# Patient Record
Sex: Male | Born: 1955 | Race: White | Hispanic: No | Marital: Married | Smoking: Never smoker
Health system: Southern US, Community
[De-identification: ages and names within clinical notes are randomized; demographics above are authoritative.]

## PROBLEM LIST (undated history)

## (undated) DIAGNOSIS — D649 Anemia, unspecified: Secondary | ICD-10-CM

## (undated) DIAGNOSIS — I350 Nonrheumatic aortic (valve) stenosis: Secondary | ICD-10-CM

## (undated) DIAGNOSIS — N189 Chronic kidney disease, unspecified: Secondary | ICD-10-CM

## (undated) DIAGNOSIS — R519 Headache, unspecified: Secondary | ICD-10-CM

## (undated) DIAGNOSIS — Z8614 Personal history of Methicillin resistant Staphylococcus aureus infection: Secondary | ICD-10-CM

## (undated) DIAGNOSIS — J40 Bronchitis, not specified as acute or chronic: Secondary | ICD-10-CM

## (undated) DIAGNOSIS — L0390 Cellulitis, unspecified: Secondary | ICD-10-CM

## (undated) DIAGNOSIS — G4733 Obstructive sleep apnea (adult) (pediatric): Secondary | ICD-10-CM

## (undated) DIAGNOSIS — K7460 Unspecified cirrhosis of liver: Secondary | ICD-10-CM

## (undated) DIAGNOSIS — M1990 Unspecified osteoarthritis, unspecified site: Secondary | ICD-10-CM

## (undated) DIAGNOSIS — Z862 Personal history of diseases of the blood and blood-forming organs and certain disorders involving the immune mechanism: Secondary | ICD-10-CM

## (undated) DIAGNOSIS — K7469 Other cirrhosis of liver: Secondary | ICD-10-CM

## (undated) DIAGNOSIS — J302 Other seasonal allergic rhinitis: Secondary | ICD-10-CM

## (undated) DIAGNOSIS — E119 Type 2 diabetes mellitus without complications: Secondary | ICD-10-CM

## (undated) DIAGNOSIS — C801 Malignant (primary) neoplasm, unspecified: Secondary | ICD-10-CM

## (undated) DIAGNOSIS — K7581 Nonalcoholic steatohepatitis (NASH): Secondary | ICD-10-CM

## (undated) HISTORY — PX: ROTATOR CUFF REPAIR: SHX139

## (undated) HISTORY — PX: CHOLECYSTECTOMY: SHX55

## (undated) HISTORY — PX: CERVICAL FUSION: SHX112

## (undated) HISTORY — PX: TONSILLECTOMY: SUR1361

---

## 2004-11-22 ENCOUNTER — Emergency Department: Payer: Self-pay | Admitting: Emergency Medicine

## 2006-06-12 ENCOUNTER — Emergency Department: Payer: Self-pay | Admitting: Internal Medicine

## 2006-10-13 ENCOUNTER — Ambulatory Visit (HOSPITAL_COMMUNITY): Admission: RE | Admit: 2006-10-13 | Discharge: 2006-10-14 | Payer: Self-pay | Admitting: Neurosurgery

## 2009-02-15 ENCOUNTER — Emergency Department: Payer: Self-pay | Admitting: Unknown Physician Specialty

## 2009-02-28 ENCOUNTER — Ambulatory Visit: Payer: Self-pay

## 2009-03-14 ENCOUNTER — Ambulatory Visit: Payer: Self-pay | Admitting: Orthopedic Surgery

## 2009-03-18 ENCOUNTER — Ambulatory Visit: Payer: Self-pay | Admitting: Orthopedic Surgery

## 2009-03-31 ENCOUNTER — Ambulatory Visit: Payer: Self-pay | Admitting: Internal Medicine

## 2009-04-27 ENCOUNTER — Inpatient Hospital Stay: Payer: Self-pay | Admitting: Internal Medicine

## 2009-04-30 ENCOUNTER — Ambulatory Visit: Payer: Self-pay | Admitting: Internal Medicine

## 2009-05-05 ENCOUNTER — Ambulatory Visit: Payer: Self-pay | Admitting: Internal Medicine

## 2009-05-31 ENCOUNTER — Ambulatory Visit: Payer: Self-pay | Admitting: Internal Medicine

## 2009-06-06 ENCOUNTER — Inpatient Hospital Stay: Payer: Self-pay | Admitting: Internal Medicine

## 2009-06-27 ENCOUNTER — Ambulatory Visit: Payer: Self-pay | Admitting: Specialist

## 2009-07-01 ENCOUNTER — Ambulatory Visit: Payer: Self-pay | Admitting: Internal Medicine

## 2009-07-21 ENCOUNTER — Ambulatory Visit: Payer: Self-pay | Admitting: Internal Medicine

## 2009-07-29 ENCOUNTER — Ambulatory Visit: Payer: Self-pay | Admitting: Internal Medicine

## 2009-08-08 ENCOUNTER — Ambulatory Visit: Payer: Self-pay | Admitting: Surgery

## 2009-08-13 ENCOUNTER — Ambulatory Visit: Payer: Self-pay | Admitting: Surgery

## 2009-08-29 ENCOUNTER — Ambulatory Visit: Payer: Self-pay | Admitting: Internal Medicine

## 2009-09-28 ENCOUNTER — Ambulatory Visit: Payer: Self-pay | Admitting: Internal Medicine

## 2009-10-29 ENCOUNTER — Ambulatory Visit: Payer: Self-pay | Admitting: Internal Medicine

## 2009-12-29 ENCOUNTER — Ambulatory Visit: Payer: Self-pay | Admitting: Internal Medicine

## 2009-12-31 ENCOUNTER — Ambulatory Visit: Payer: Self-pay | Admitting: Internal Medicine

## 2010-01-08 ENCOUNTER — Ambulatory Visit: Payer: Self-pay | Admitting: Gastroenterology

## 2010-06-23 ENCOUNTER — Ambulatory Visit: Payer: Self-pay | Admitting: Internal Medicine

## 2010-09-17 IMAGING — CT CT ABD-PELV W/ CM
1 of 2 series · 16 of 32 positions shown, 20 images · non-contrast
Comparison: none

REASON FOR EXAM: splenomegaly  LLQ pain  pancytopenia eval
lymphadenopathy that would be Malawani...
COMMENTS:

PROCEDURE:     LANGAWI - LANGAWI ABDOMEN / PELVIS W  - July 29, 2009  [DATE]
RESULT:
HISTORY: Pancytopenia, splenomegaly.
COMPARISON STUDY:   Ultrasound of the abdomen of 07/21/2009.

[Series 2: soft tissue · axial · 0.86mm/px · z∈[-1072,-602]mm · 16 of 104 slices shown, 20 images]
[im 5/104  soft-tissue]
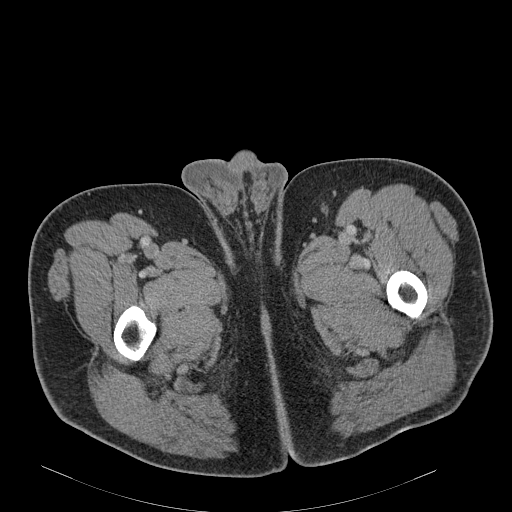
[im 5/104  bone]
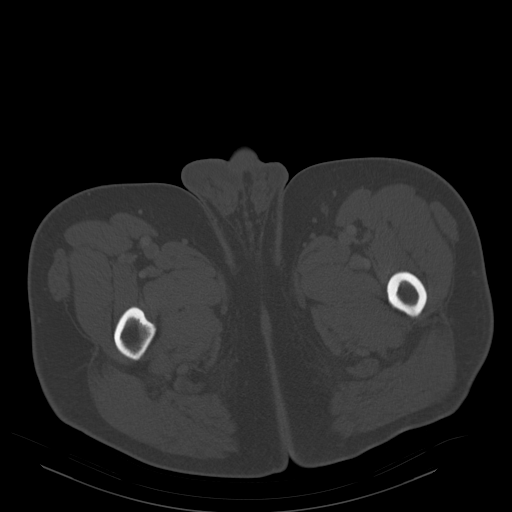
[im 13/104  soft-tissue]
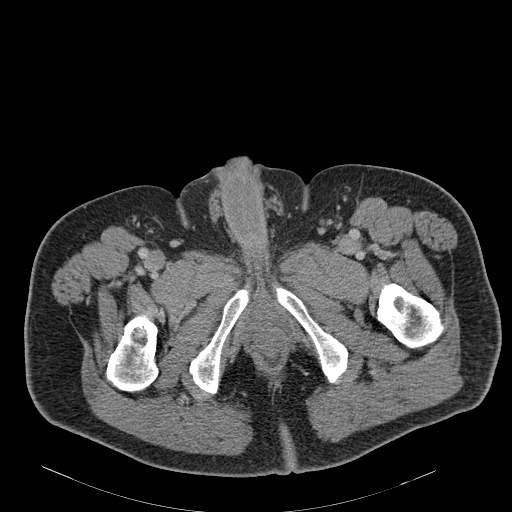
[im 22/104  soft-tissue]
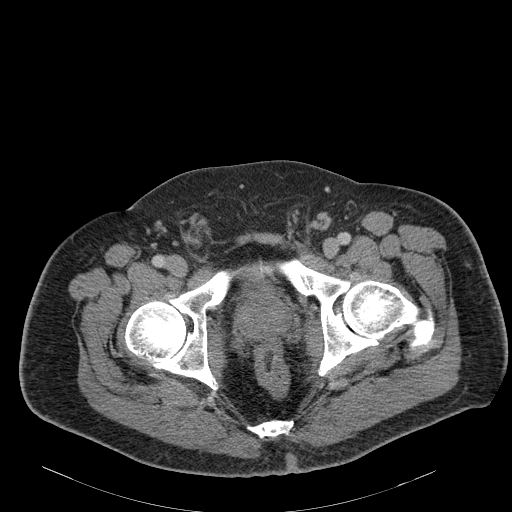
[im 26/104  soft-tissue]
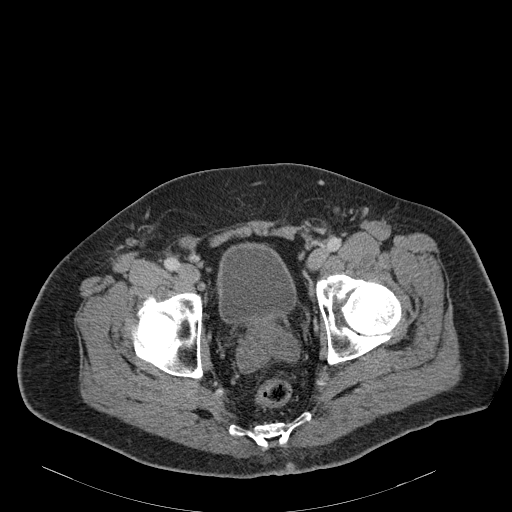
[im 35/104  soft-tissue]
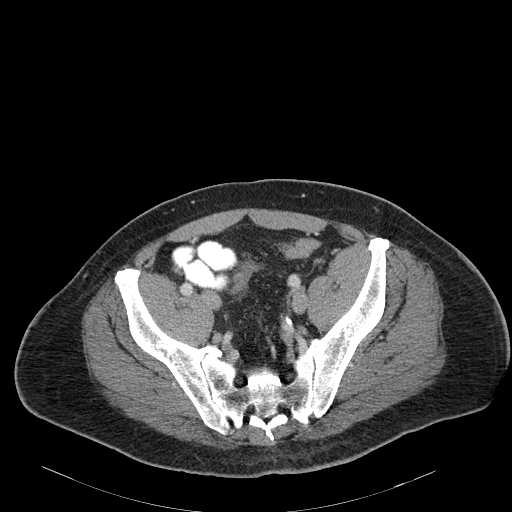
[im 43/104  soft-tissue]
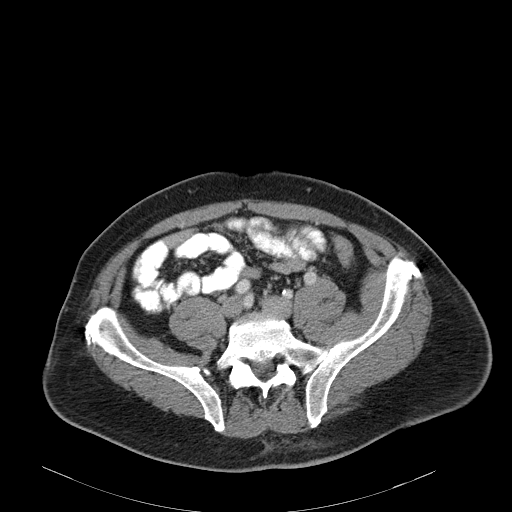
[im 48/104  soft-tissue]
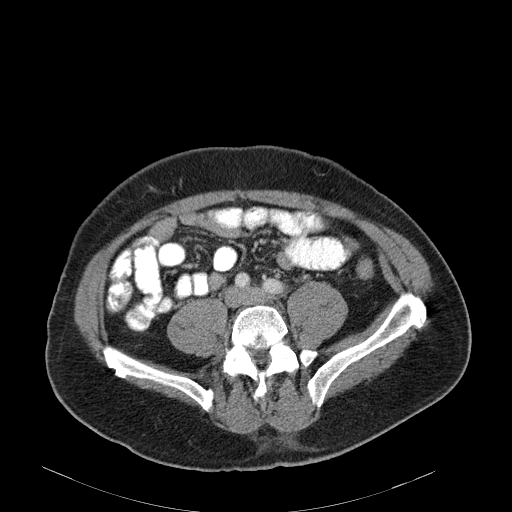
[im 56/104  soft-tissue]
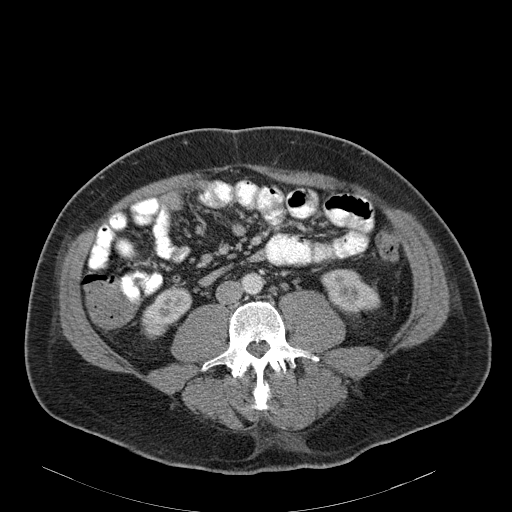
[im 61/104  soft-tissue]
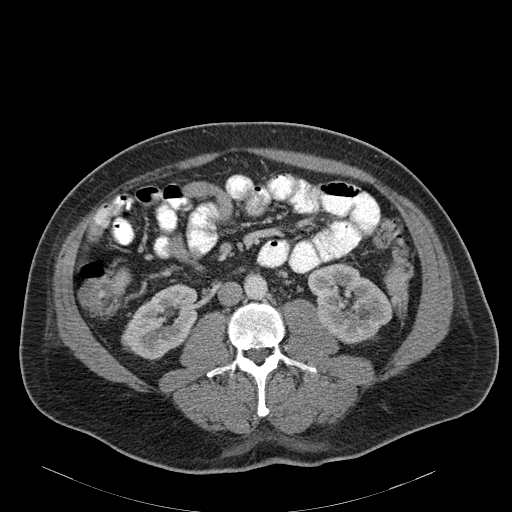
[im 61/104  bone]
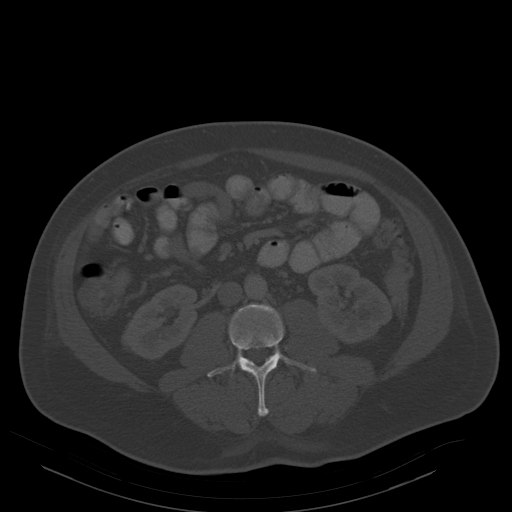
[im 69/104  soft-tissue]
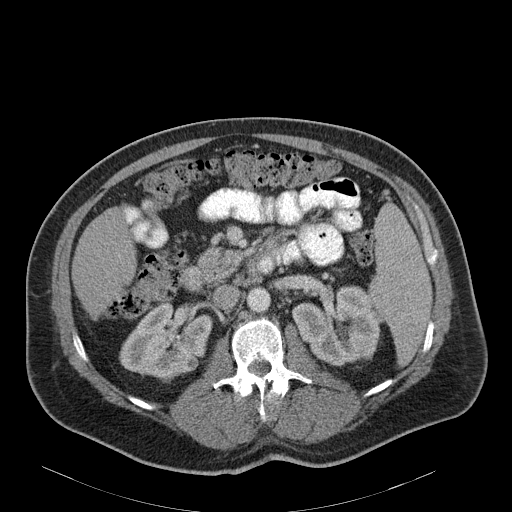
[im 78/104  soft-tissue]
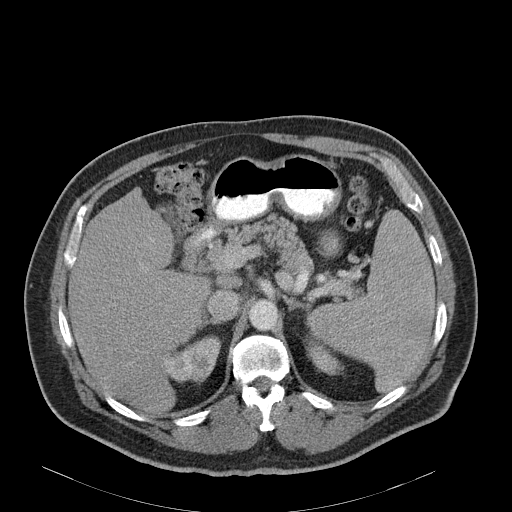
[im 82/104  soft-tissue]
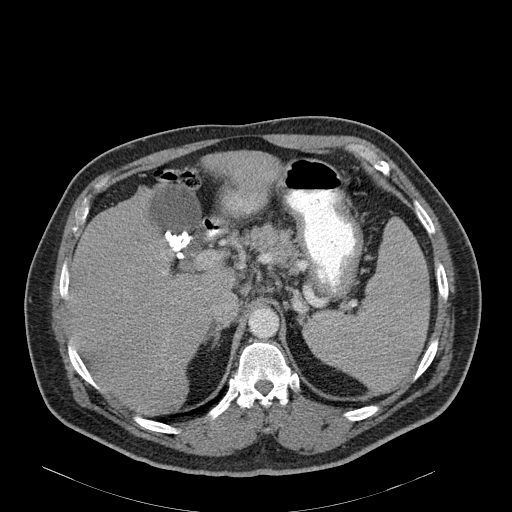
[im 86/104  lung]
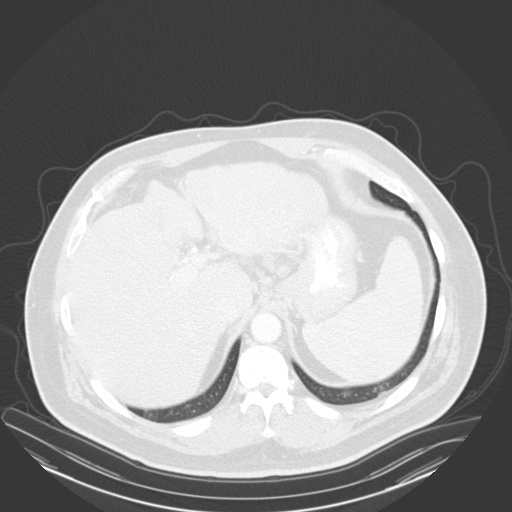
[im 91/104  soft-tissue]
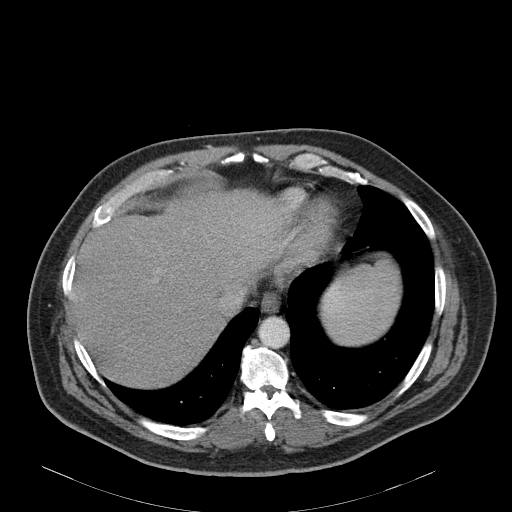
[im 91/104  lung]
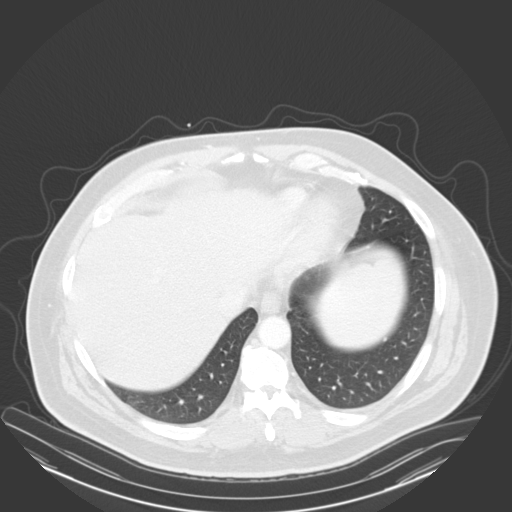
[im 95/104  lung]
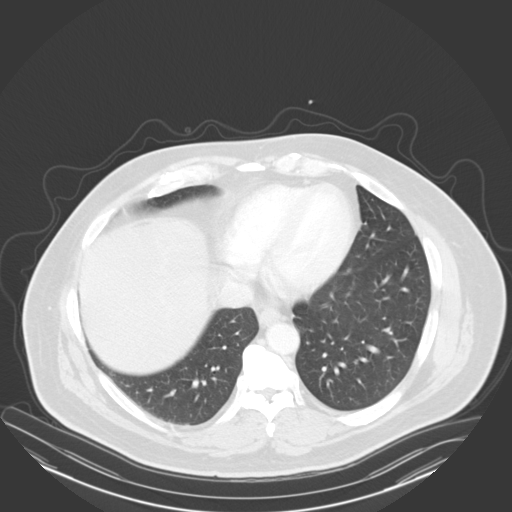
[im 99/104  soft-tissue]
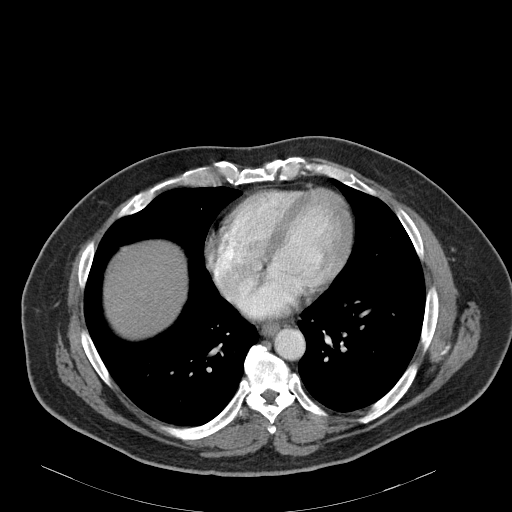
[im 99/104  lung]
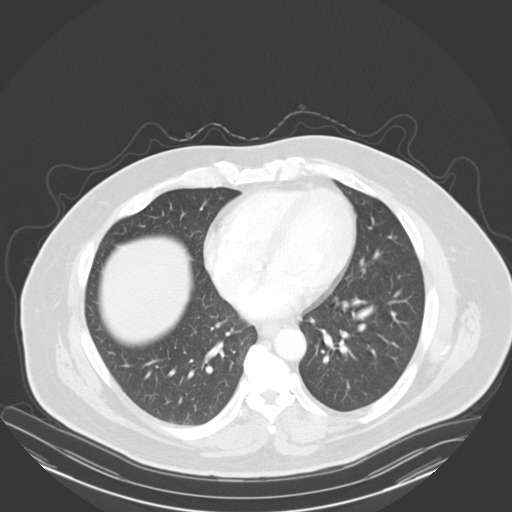

[16 of 32 positions shown; findings below may reference images not displayed]

FINDINGS: The liver is normal. Gallstones are present. Small
parapancreatic nonspecific lymph nodes are noted. No gallbladder distention
is noted. The pancreas is normal. No biliary distention. Mild splenomegaly
is present. Appendix is normal. Mesenteric vessels are patent. Portal vein
is patent. The lung bases are clear. No free air. Adrenals are normal.  The
kidneys are normal. Bilateral inguinal hernias are present.  No free pelvic
fluid is noted. The bladder is normal.
IMPRESSION: 1.     Mild splenomegaly.
2.     Gallstones.

## 2010-10-13 NOTE — Op Note (Signed)
Nathan Schultz, Nathan Schultz               ACCOUNT NO.:  [id number]   MEDICAL RECORD NO.:  [id number]          PATIENT TYPE:  AMB   LOCATION:  SDS                          FACILITY:  [facility]   PHYSICIAN:  Marchia Meiers. Vertell Limber, M.D.  DATE OF BIRTH:  10/01/1955   DATE OF PROCEDURE:  10/13/2006  DATE OF DISCHARGE:                               OPERATIVE REPORT   PREOPERATIVE DIAGNOSIS:  Herniated cervical disk with cervical  spondylosis, stenosis and radiculopathy, C6-7.   POSTOPERATIVE DIAGNOSIS:  Herniated cervical disk with cervical  spondylosis, stenosis and radiculopathy, C6-7.   PROCEDURE:  Anterior cervical decompression and fusion, C6-7, with PEEK  interbody cage, morselized bone autograft and anterior cervical plate.   SURGEON:  Marchia Meiers. Vertell Limber, M.D.   ASSISTANTS:  1. Verdis Prime, R.N.  2. Leeroy Cha, M.D.   ANESTHESIA:  General endotracheal anesthesia.   ESTIMATED BLOOD LOSS:  Minimal.   COMPLICATIONS:  None.   DISPOSITION:  To Recovery.   INDICATIONS:  Nathan Schultz is a 55 year old [employer] with left  arm weakness with a significant disk herniation at C6-7 on the left.  It  was elected to take him to surgery for anterior cervical decompression  and fusion at the C6-7 level.   PROCEDURE:  [name] was brought to the operating room.  Following a  satisfactory and uncomplicated induction of general endotracheal  anesthesia and placement of intravenous lines, the patient was placed in  a supine position on the operating table.  His neck was placed in slight  extension and he was placed in 10 pounds of halter traction.  His  anterior neck was then prepped and draped in the usual sterile fashion.  After infiltrating skin and subcutaneous tissues with 0.25% Marcaine,  0.5% lidocaine and 1:200,000 epinephrine, an incision was made from the  midline to the anterior border of the sternocleidomastoid muscle and  carried sharply through the platysmal layer.  Subplatysmal  dissection  was performed, exposing the anterior border of the sternocleidomastoid  muscle using blunt dissection.  The carotid sheath was kept lateral and  trachea and esophagus kept medial, exposing the anterior cervical spine.  A bent spinal needle was placed at what was felt to be the C5-6 and C6-7  levels.  Because of the patient's large body habitus, with multiple x-  rays were able to obtain visualization of the C5-6 level.  The longus  colli muscles were then taken down from the anterior cervical spine from  C6 through C7 with electrocautery and Key elevator and a Shadowline self-  retaining retractor was placed to facilitate exposure.  Leksell rongeur  was used to remove large ventral osteophytes and these were retained for  later use in bone grafting.  Subsequently, the interspace was incised  and disk material was removed in piecemeal fashion.  Endplates were  stripped of residual disk material.  The disk space spreader was placed  and under microscopic visualization, the endplates were decorticated.  There was a large shelf of bone inferiorly overlying C7 and the disk  space itself appeared to be quite calcified.  Under microdissection  technique, the remaining bone was thinned and then lifted with a micro  hook and 1-mm Kerrison rongeur, eventually exposing the thecal sac and  spinal cord dura.  The spinal cord dura was then decompressed initially  to the right side and subsequently to the left.  On the left side, there  was a fragment of disk material directly over the C7 nerve root and this  was decompressed.  The C7 nerve root was decompressed widely and then  extended out the neural foramen and attention was then turned to the  right side, where a similar decompression was performed.  Hemostasis was  assured with Gelfoam soaked in thrombin and after trial sizing, an 8-mm  medium PEEK interbody cage was selected, packed with morcellized bone  autograft, which was preserved  from the drilling of the endplates.  This  bone graft was then packed in the cage and the cage was inserted in the  interspace and countersunk appropriately.  A 16-millimeter Trestle  anterior cervical plate was then affixed to the anterior cervical spine  using 14-mm variable-angle screws, 2 at C6 and two at C7; all screws had  excellent purchase.  Locking mechanisms were engaged.  Final x-ray was  coned down and demonstrated the hardware at the C6-7 level.  Hemostasis  was assured and soft tissues were inspected and found to be in good  repair.  Subsequently, the platysmal layer was closed with 3-0 Vicryl  sutures and skin edges were approximated with 3-0 Vicryl interrupted,  inverted sutures.  Wound was dressed with Dermabond.  The patient was  extubated in the operating room and taken to the recovery room in stable  and satisfactory, having tolerated his operation well.  Counts were  correct at the end of the case.      Marchia Meiers. Vertell Limber, M.D.  Electronically Signed     JDS/MEDQ  D:  10/13/2006  T:  10/14/2006  Job:  [id number]

## 2010-10-13 NOTE — Op Note (Signed)
NAMEBERTRAND, VOWELS               ACCOUNT NO.:  [id number]   MEDICAL RECORD NO.:  [id number]          PATIENT TYPE:  AMB   LOCATION:  SDS                          FACILITY:  [facility]   PHYSICIAN:  Marchia Meiers. Vertell Limber, M.D.  DATE OF BIRTH:  [birth year]   DATE OF PROCEDURE:  DATE OF DISCHARGE:                               OPERATIVE REPORT   Audio too short to transcribe (less than 5 seconds)      Marchia Meiers. Vertell Limber, M.D.     JDS/MEDQ  D:  10/13/2006  T:  10/13/2006  Job:  [id number]

## 2011-03-01 ENCOUNTER — Ambulatory Visit: Payer: Self-pay | Admitting: Gastroenterology

## 2011-08-25 ENCOUNTER — Ambulatory Visit: Payer: Self-pay | Admitting: Gastroenterology

## 2012-09-07 ENCOUNTER — Ambulatory Visit: Payer: Self-pay | Admitting: Gastroenterology

## 2013-10-02 DIAGNOSIS — K7469 Other cirrhosis of liver: Secondary | ICD-10-CM | POA: Insufficient documentation

## 2013-10-02 DIAGNOSIS — K7682 Hepatic encephalopathy: Secondary | ICD-10-CM | POA: Insufficient documentation

## 2013-10-02 DIAGNOSIS — K7290 Hepatic failure, unspecified without coma: Secondary | ICD-10-CM | POA: Insufficient documentation

## 2013-12-10 ENCOUNTER — Inpatient Hospital Stay: Payer: Self-pay | Admitting: Internal Medicine

## 2013-12-10 LAB — BASIC METABOLIC PANEL
Anion Gap: 11 (ref 7–16)
BUN: 21 mg/dL — AB (ref 7–18)
CALCIUM: 8.6 mg/dL (ref 8.5–10.1)
CO2: 25 mmol/L (ref 21–32)
Chloride: 104 mmol/L (ref 98–107)
Creatinine: 1.42 mg/dL — ABNORMAL HIGH (ref 0.60–1.30)
GFR CALC NON AF AMER: 54 — AB
GLUCOSE: 228 mg/dL — AB (ref 65–99)
Osmolality: 290 (ref 275–301)
POTASSIUM: 3.5 mmol/L (ref 3.5–5.1)
SODIUM: 140 mmol/L (ref 136–145)

## 2013-12-10 LAB — CBC WITH DIFFERENTIAL/PLATELET
BASOS ABS: 0.1 10*3/uL (ref 0.0–0.1)
Basophil %: 0.7 %
EOS ABS: 0.1 10*3/uL (ref 0.0–0.7)
EOS PCT: 1.6 %
HCT: 34 % — ABNORMAL LOW (ref 40.0–52.0)
HGB: 12.3 g/dL — ABNORMAL LOW (ref 13.0–18.0)
LYMPHS ABS: 1.3 10*3/uL (ref 1.0–3.6)
Lymphocyte %: 14.7 %
MCH: 34.6 pg — ABNORMAL HIGH (ref 26.0–34.0)
MCHC: 36.1 g/dL — AB (ref 32.0–36.0)
MCV: 96 fL (ref 80–100)
MONOS PCT: 12.4 %
Monocyte #: 1.1 x10 3/mm — ABNORMAL HIGH (ref 0.2–1.0)
NEUTROS ABS: 6.3 10*3/uL (ref 1.4–6.5)
Neutrophil %: 70.6 %
Platelet: 59 10*3/uL — ABNORMAL LOW (ref 150–440)
RBC: 3.56 10*6/uL — AB (ref 4.40–5.90)
RDW: 13.9 % (ref 11.5–14.5)
WBC: 8.9 10*3/uL (ref 3.8–10.6)

## 2013-12-10 LAB — CK TOTAL AND CKMB (NOT AT ARMC)
CK, TOTAL: 132 U/L
CK, TOTAL: 116 U/L
CK, Total: 179 U/L
CK-MB: 1.9 ng/mL (ref 0.5–3.6)
CK-MB: 1.4 ng/mL (ref 0.5–3.6)
CK-MB: 1.5 ng/mL (ref 0.5–3.6)

## 2013-12-10 LAB — TROPONIN I
Troponin-I: <0.02 ng/mL
Troponin-I: <0.02 ng/mL
Troponin-I: <0.02 ng/mL

## 2013-12-11 LAB — BASIC METABOLIC PANEL
ANION GAP: 7 (ref 7–16)
BUN: 20 mg/dL — AB (ref 7–18)
CALCIUM: 8.5 mg/dL (ref 8.5–10.1)
CHLORIDE: 107 mmol/L (ref 98–107)
CO2: 25 mmol/L (ref 21–32)
Creatinine: 1.14 mg/dL (ref 0.60–1.30)
EGFR (African American): >60
Glucose: 155 mg/dL — ABNORMAL HIGH (ref 65–99)
Osmolality: 283 (ref 275–301)
POTASSIUM: 3.9 mmol/L (ref 3.5–5.1)
Sodium: 139 mmol/L (ref 136–145)

## 2013-12-11 LAB — CBC WITH DIFFERENTIAL/PLATELET
Basophil #: 0 10*3/uL (ref 0.0–0.1)
Basophil %: 0.6 %
EOS ABS: 0.2 10*3/uL (ref 0.0–0.7)
Eosinophil %: 3.5 %
HCT: 34.5 % — AB (ref 40.0–52.0)
HGB: 12.1 g/dL — AB (ref 13.0–18.0)
Lymphocyte #: 1.5 10*3/uL (ref 1.0–3.6)
Lymphocyte %: 27.4 %
MCH: 33.9 pg (ref 26.0–34.0)
MCHC: 35 g/dL (ref 32.0–36.0)
MCV: 97 fL (ref 80–100)
MONO ABS: 0.7 x10 3/mm (ref 0.2–1.0)
Monocyte %: 12.1 %
NEUTROS ABS: 3.1 10*3/uL (ref 1.4–6.5)
Neutrophil %: 56.4 %
Platelet: 59 10*3/uL — ABNORMAL LOW (ref 150–440)
RBC: 3.56 10*6/uL — AB (ref 4.40–5.90)
RDW: 13.5 % (ref 11.5–14.5)
WBC: 5.5 10*3/uL (ref 3.8–10.6)

## 2013-12-11 LAB — MAGNESIUM: Magnesium: 1.4 mg/dL — ABNORMAL LOW

## 2013-12-15 LAB — CULTURE, BLOOD (SINGLE)

## 2014-09-21 NOTE — Discharge Summary (Signed)
PATIENT NAME:  Nathan Schultz, Nathan Schultz MR#:  [id number] DATE OF BIRTH:  1955-09-08  DATE OF ADMISSION:  12/10/2013. DATE OF DISCHARGE:  12/12/2013.  DISCHARGE DIAGNOSES:  1.  Sepsis due to his cellulitis, left leg.  2.  Diabetes mellitus, non-insulin-requiring.  3.  Chronic lower extremity edema.  4.  Nonalcoholic fatty liver sclerosis.  5.  History of hepatic encephalopathy.   DISCHARGE MEDICATIONS:  Amaryl 2 mg b.i.d., potassium 20 mEq b.i.d., torsemide 20 mg daily, Align 4 mg daily, Janumet 50/1000 b.i.d., neomycin 1000 mg daily, lactulose 30 mg b.i.d., Septra DS 1 tablet b.i.d.   REASON FOR ADMISSION: A 59 year old male presents with fever, lethargy, and left leg erythema. Please see H and P for history of HPI, past medical history and physical exam.   HOSPITAL COURSE: The patient was admitted. V/Q scan and left leg Doppler were both negative for clot. He was aggressively diuresed with some success and given IV ceftriaxone and Levaquin for the cellulitis. The erythema improved. His sepsis resolved. Blood cultures were negative. His fever came down. His white count normalized and he was sent home on sulfa, oral antibiotics to follow up with Dr. Sabra Heck in 1 week.    ____________________________ Rusty Aus, MD mfm:lt D: 12/12/2013 07:36:22 ET T: 12/12/2013 07:51:25 ET JOB#: [id number]  cc: Rusty Aus, MD, <Dictator> MARK Roselee Culver MD ELECTRONICALLY SIGNED 12/12/2013 8:19

## 2014-09-21 NOTE — H&P (Signed)
PATIENT NAME:  Nathan Schultz, Nathan Schultz MR#:  [id number] DATE OF BIRTH:  [birth year]  DATE OF ADMISSION:  12/10/2013  REFERRING PHYSICIAN: Dr. Lurline Hare  PRIMARY CARE PHYSICIAN: Dr. Emily Filbert  CHIEF COMPLAINT: Presents with multiple complaints, including worsening left lower extremity cellulitis and chest pain.   HISTORY OF PRESENT ILLNESS: This is a 59 year old male with known history of recurrent left lower extremity cellulitis. Has had multiple courses of p.o. antibiotics as an outpatient, usually Bactrim. The patient had left lower extremity erythema, swelling and tenderness for the last 48 hours. Reports that circulatory was trying to clean his ingrown toenail. He reports fever at home of 103, and having some chills. Reports he started to take Bactrim on Saturday, which did not help much, so he came to the ED. The patient was afebrile. He did not have significant leukocytosis, but has significant cellulitis in the left lower extremity, extended from the foot all the way to below the knee. As well, the patient started complaining of chest pain, intermittent, sharp quality over the last two weeks. No relieving, no provoking factors. It was accompanied by some mild shortness of breath as well. He denies any recent flying or travel. Reports he did drive to [place], but most recent was in May of this year. As well, the patient reports his chest pain is worsening by deep inspiration. He reports cough as well. Reports sneezing sometimes with bloody secretions as well. Hospitalist service was requested to admit the patient for further need for IV antibiotics for his cellulitis, and further work-up of his chest pain.   PAST MEDICAL HISTORY: 1.  Diabetes mellitus.  2.  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  3.  Chronic lower extremity edema. 4.  Nonalcoholic fatty liver cirrhosis.  5.  Hepatic encephalopathy.  6.  Continued ongoing treatment for left  lower extremity cellulitis.    PAST SURGICAL HISTORY: 1.  Neck surgery with anterior cervical fusion at C5, C6 level. 2.  Right rotator cuff tear repair, 2010.  3.  Cholecystectomy, 2011.    HOME MEDICATIONS:  1.  Amaryl 2 mg oral 2 times a day.  2.  Potassium 20 mEq oral 2 times a day.  3.  Torsemide 20 mg oral daily.  4.  Align capsule 4 mg oral daily.  5.  Janumet tablet 50/1000 mg oral 2 times a day.  6.  Neomycin tablets 1000 mg oral daily.  7.  Lactulose 30 mg oral 2 times a day.    ALLERGIES: CLINDAMYCIN, DOXYCYCLINE, TRIAMTERENE HYDROCHLOROTHIAZIDE, VANCOMYCIN. CLINDAMYCIN VERSUS VANCOMYCIN AS SUSPICIOUS CAUSE OF HIS IDIOPATHIC THROMBOCYTOPENIA PURPURA IN THE PAST.   SOCIAL HISTORY: No alcohol. No smoking. The patient works in the [employer] office.   FAMILY HISTORY: Significant for diabetes, prostate cancer and coronary artery disease in the family.   REVIEW OF SYSTEMS: CONSTITUTIONAL: The patient denies fever, chills, fatigue.  EYES: Denies blurry vision, double vision, inflammation.  ENT: Denies tinnitus, ear pain, hearing loss, epistaxis or discharge.  RESPIRATORY: The patient reports cough, productive sputum, sometimes blood-tinged, sneezing. Reports occasional dyspnea with this chest pain. Denies any chronic obstructive pulmonary disease.  CARDIOVASCULAR: Reports chest pain, intermittent. Denies any edema, arrhythmia, palpitations, syncope.  GASTROINTESTINAL: Denies nausea, vomiting, diarrhea, abdominal pain, hematemesis.  GENITOURINARY: Denies dysuria, hematuria, renal colic.  ENDOCRINE: Denies polyuria, polydipsia, heat or cold intolerance.  HEMATOLOGY: Denies anemia, easy bruising.  INTEGUMENTARY: Denies acne, rash or skin lesion.  MUSCULOSKELETAL: Denies any swelling, gout, cramps.  NEUROLOGIC: Denies  history of CVA, transient ischemic attack, tremors, vertigo, ataxia.  PSYCHIATRIC: Denies anxiety, insomnia, or depression.   PHYSICAL EXAMINATION: VITAL SIGNS:  Temperature 98.8, pulse 87, respiratory rate 18, blood pressure 128/87, saturating 99% on room air.  GENERAL: Well-nourished male, looks comfortable in bed, in no apparent distress.  HEENT: Head atraumatic, normocephalic. Pupils equal, reactive to light. Pink conjunctivae. Anicteric sclerae. Moist oral mucosa.  NECK: Supple. No thyromegaly. No JVD.  CHEST: Good air entry bilaterally. No wheezing, rales, rhonchi.  CARDIOVASCULAR: S1, S2 heard. No rubs, murmur or gallops.  ABDOMEN: Soft, nontender, nondistended. Bowel sounds present.  EXTREMITIES: Pedal pulses +2 bilaterally. Left lower extremity has erythema, swelling and tenderness extending to below the knee.  PSYCHIATRIC: Appropriate affect. Awake, alert x3. Intact judgment and insight.  NEUROLOGIC: Cranial nerves grossly intact. Motor 5/5. No focal deficits.  MUSCULOSKELETAL: No joint effusion or erythema.   PERTINENT LABORATORY DATA: Glucose 228, BUN 21, creatinine 1.42, sodium 140, potassium 3.5, chloride 104, CO2 25. Troponin less than 0.02. White blood cells 8.9, hemoglobin 12.3, hematocrit 34, platelets 59.   ASSESSMENT AND PLAN: 1.  Left lower extremity cellulitis. This appears to be recurrent. The patient will be started on IV Rocephin. Given his multiple drug allergies, we will continue him on oral Bactrim for methicillin-resistant Staphylococcus aureus coverage. As well, we will follow on the urine cultures and adjust if needed. As well, we will obtain venous Doppler of the left lower extremity to rule out deep vein thrombosis.  2.  Chest pain. Sharp chest pain, pleuritic in quality, accompanied by shortness of breath. There is a high suspicion of pulmonary embolus at this point, but given the patient's worsening renal function, cannot obtain CT chest angiogram, so we will obtain V/Q scan in the morning to rule out pulmonary embolus. We will give him one dose of Lovenox until we are able to do the V/Q scan in the morning. Plan was  discussed with the patient and wife.  3.  Acute renal failure, mild, with a GFR of 54. We will hold his torsemide. We will hydrate. We will avoid nephrotoxic medication.  4.  Diabetes mellitus. We will start the patient on insulin sliding scale. We will hold his metformin, given his worsening renal function.  5.  Nonalcoholic fatty liver cirrhosis with history of hepatic encephalopathy. We will continue the patient on lactulose and neomycin.  6.   Thrombocytopenia.  chronic but worsening, most likely related to his liver disease, will moniotr closley. 7.  Deep vein thrombosis prophylaxis. The patient was given one dose of Lovenox treatment, out of concern of pulmonary embolus. After that, depending on the findings on the V/Q scan in the morning, decision will be made for treatment versus prophylaxis dose.   CODE STATUS: Full code.  TOTAL TIME SPENT ON ADMISSION AND PATIENT CARE: 55 minutes.      ____________________________ Albertine Patricia, MD dse:cg D: 12/10/2013 04:37:46 ET T: 12/10/2013 05:25:04 ET JOB#: [id number]  cc: Albertine Patricia, MD, <Dictator> Mishal Probert Graciela Husbands MD ELECTRONICALLY SIGNED 12/10/2013 6:33

## 2015-05-08 ENCOUNTER — Encounter: Payer: Self-pay | Admitting: Physician Assistant

## 2015-05-08 ENCOUNTER — Ambulatory Visit: Payer: Self-pay | Admitting: Physician Assistant

## 2015-05-08 VITALS — BP 125/70 | HR 88 | Temp 98.8°F

## 2015-05-08 DIAGNOSIS — J018 Other acute sinusitis: Secondary | ICD-10-CM

## 2015-05-08 MED ORDER — FLUTICASONE PROPIONATE 50 MCG/ACT NA SUSP: 2.0000 | Freq: Every day | NASAL | Status: DC

## 2015-05-08 MED ORDER — AMOXICILLIN-POT CLAVULANATE 875-125 MG PO TABS: 1.0000 | ORAL_TABLET | Freq: Two times a day (BID) | ORAL | Status: DC

## 2015-05-08 MED ORDER — ALIGN PO CAPS: 1.0000 | ORAL_CAPSULE | Freq: Every day | ORAL | Status: DC

## 2015-05-08 NOTE — Progress Notes (Signed)
S: C/o runny nose and congestion for over a month, no fever, chills, cp/sob, v/d; mucus is green and thick, c/o of facial and dental pain, can't breathe through his nose  Using otc meds:   O: PE: vitals wnl, nad,  perrl eomi, normocephalic, tms dull, nasal mucosa red and swollen, throat injected, neck supple no lymph, lungs c t a, cv rrr, neuro intact  A:  Acute sinusitis   P: augmentin 82m bid x 10d, flonase, drink fluids, continue regular meds , use otc meds of choice, return if not improving in 5 days, return earlier if worsening

## 2015-05-12 DIAGNOSIS — E119 Type 2 diabetes mellitus without complications: Secondary | ICD-10-CM | POA: Insufficient documentation

## 2015-05-29 ENCOUNTER — Ambulatory Visit: Payer: Self-pay | Admitting: Family

## 2015-05-29 ENCOUNTER — Encounter: Payer: Self-pay | Admitting: Family

## 2015-05-29 VITALS — BP 130/70 | HR 85 | Temp 98.2°F

## 2015-05-29 DIAGNOSIS — R05 Cough: Secondary | ICD-10-CM

## 2015-05-29 DIAGNOSIS — R059 Cough, unspecified: Secondary | ICD-10-CM

## 2015-05-29 DIAGNOSIS — Z862 Personal history of diseases of the blood and blood-forming organs and certain disorders involving the immune mechanism: Secondary | ICD-10-CM | POA: Insufficient documentation

## 2015-05-29 DIAGNOSIS — J0191 Acute recurrent sinusitis, unspecified: Secondary | ICD-10-CM

## 2015-05-29 MED ORDER — LEVOFLOXACIN 500 MG PO TABS: 500.0000 mg | ORAL_TABLET | Freq: Every day | ORAL | Status: DC

## 2015-05-29 MED ORDER — HYDROCOD POLST-CPM POLST ER 10-8 MG/5ML PO SUER: 5.0000 mL | Freq: Two times a day (BID) | ORAL | Status: DC | PRN

## 2015-05-29 NOTE — Progress Notes (Signed)
S/ recurrant  sxs 1.5 wks after completing amox, blowing blood mixed d/c , cough cant sleep at night , DOE , malaise   O/ nad alert pleasant mildly ill ,  ENT tms dull retracted , left red, nasal mucosa excoriated , bloody purulent d/c phaynx clear , neck supple, heart rsr lungs are clear  A/ sinusitis , cough P rx levaquin , tussionex . Hydration , nasal saline products and supportive measures.

## 2015-06-06 ENCOUNTER — Ambulatory Visit: Payer: Self-pay | Admitting: Physician Assistant

## 2015-06-06 ENCOUNTER — Encounter: Payer: Self-pay | Admitting: Physician Assistant

## 2015-06-06 VITALS — BP 150/70 | HR 88 | Temp 97.8°F

## 2015-06-06 DIAGNOSIS — J069 Acute upper respiratory infection, unspecified: Secondary | ICD-10-CM

## 2015-06-06 MED ORDER — METHYLPREDNISOLONE 4 MG PO TBPK: ORAL_TABLET | ORAL | Status: DC

## 2015-06-06 MED ORDER — CEFDINIR 300 MG PO CAPS: 300.0000 mg | ORAL_CAPSULE | Freq: Two times a day (BID) | ORAL | Status: DC

## 2015-06-06 NOTE — Progress Notes (Signed)
S: finished levaquin but still has prod cough with green mucus, no fever/chills, has a lot of head pressure, no cp/sob; works at [employer] so thinks he just keeps picking up more germs  O: vitals wnl, nad, tms dull, nasal mucosa inflamed, throat wnl, neck supple no lymph lungs c t a, cv rrr  A: acute recurrent uri  P: omnicef , medrol dose pack, f/u with pcp if not improving 3-5 days, use saline nasal spray bid

## 2016-09-14 ENCOUNTER — Other Ambulatory Visit: Payer: Self-pay | Admitting: Internal Medicine

## 2016-09-14 DIAGNOSIS — M5010 Cervical disc disorder with radiculopathy, unspecified cervical region: Secondary | ICD-10-CM

## 2016-09-20 ENCOUNTER — Ambulatory Visit: Payer: Managed Care, Other (non HMO)

## 2016-09-23 ENCOUNTER — Ambulatory Visit
Admission: RE | Admit: 2016-09-23 | Discharge: 2016-09-23 | Disposition: A | Discharge status: Home / Self Care | Payer: Managed Care, Other (non HMO) | Source: Ambulatory Visit | Attending: Internal Medicine | Admitting: Internal Medicine

## 2016-09-23 DIAGNOSIS — M2578 Osteophyte, vertebrae: Secondary | ICD-10-CM | POA: Diagnosis not present

## 2016-09-23 DIAGNOSIS — M4802 Spinal stenosis, cervical region: Secondary | ICD-10-CM | POA: Diagnosis not present

## 2016-09-23 DIAGNOSIS — S39012A Strain of muscle, fascia and tendon of lower back, initial encounter: Secondary | ICD-10-CM | POA: Insufficient documentation

## 2016-09-23 DIAGNOSIS — M5010 Cervical disc disorder with radiculopathy, unspecified cervical region: Secondary | ICD-10-CM | POA: Diagnosis present

## 2016-09-23 DIAGNOSIS — M4692 Unspecified inflammatory spondylopathy, cervical region: Secondary | ICD-10-CM | POA: Diagnosis not present

## 2016-09-23 DIAGNOSIS — Z981 Arthrodesis status: Secondary | ICD-10-CM | POA: Diagnosis not present

## 2016-09-23 DIAGNOSIS — X58XXXA Exposure to other specified factors, initial encounter: Secondary | ICD-10-CM | POA: Insufficient documentation

## 2016-09-28 ENCOUNTER — Ambulatory Visit: Payer: Managed Care, Other (non HMO)

## 2016-10-11 ENCOUNTER — Ambulatory Visit: Payer: Managed Care, Other (non HMO)

## 2017-01-06 ENCOUNTER — Ambulatory Visit
Admission: RE | Admit: 2017-01-06 | Discharge: 2017-01-06 | Disposition: A | Discharge status: Home / Self Care | Payer: Worker's Compensation | Source: Ambulatory Visit | Attending: Family | Admitting: Family

## 2017-01-06 ENCOUNTER — Other Ambulatory Visit: Payer: Self-pay | Admitting: Family

## 2017-01-06 DIAGNOSIS — M1712 Unilateral primary osteoarthritis, left knee: Secondary | ICD-10-CM | POA: Diagnosis not present

## 2017-01-06 DIAGNOSIS — M25562 Pain in left knee: Secondary | ICD-10-CM | POA: Insufficient documentation

## 2017-01-06 DIAGNOSIS — M25522 Pain in left elbow: Secondary | ICD-10-CM | POA: Diagnosis present

## 2017-01-06 DIAGNOSIS — R52 Pain, unspecified: Secondary | ICD-10-CM

## 2017-01-06 DIAGNOSIS — S59902A Unspecified injury of left elbow, initial encounter: Secondary | ICD-10-CM | POA: Insufficient documentation

## 2017-01-06 DIAGNOSIS — S8992XA Unspecified injury of left lower leg, initial encounter: Secondary | ICD-10-CM | POA: Diagnosis not present

## 2017-01-06 DIAGNOSIS — T1490XA Injury, unspecified, initial encounter: Secondary | ICD-10-CM

## 2017-01-06 DIAGNOSIS — W19XXXA Unspecified fall, initial encounter: Secondary | ICD-10-CM | POA: Insufficient documentation

## 2017-01-25 DIAGNOSIS — G4733 Obstructive sleep apnea (adult) (pediatric): Secondary | ICD-10-CM | POA: Insufficient documentation

## 2017-01-25 DIAGNOSIS — Z9989 Dependence on other enabling machines and devices: Secondary | ICD-10-CM

## 2017-05-03 ENCOUNTER — Encounter: Payer: Self-pay | Admitting: Physician Assistant

## 2017-05-03 ENCOUNTER — Ambulatory Visit: Payer: Self-pay | Admitting: Physician Assistant

## 2017-05-03 VITALS — BP 120/70 | HR 82 | Temp 98.2°F | Resp 16

## 2017-05-03 DIAGNOSIS — R059 Cough, unspecified: Secondary | ICD-10-CM

## 2017-05-03 DIAGNOSIS — R05 Cough: Secondary | ICD-10-CM

## 2017-05-03 MED ORDER — FEXOFENADINE-PSEUDOEPHED ER 60-120 MG PO TB12: 1.0000 | ORAL_TABLET | Freq: Two times a day (BID) | ORAL | 0 refills | Status: DC

## 2017-05-03 MED ORDER — BENZONATATE 100 MG PO CAPS: 200.0000 mg | ORAL_CAPSULE | Freq: Three times a day (TID) | ORAL | 0 refills | Status: DC | PRN

## 2017-05-03 NOTE — Progress Notes (Signed)
   Subjective: Cold symptoms     Patient ID: Nathan Schultz, male    DOB: 1955-10-07, 61 y.o.   MRN: [id number]  HPI Patient stated onset of nasal congestion, postnasal drainage and nonproductive cough which began yesterday. Patient denies fever or chills associated this complaint. Patient state there is bilateral ear fullness but denies hearing loss. Patient denies nausea, vomiting, diarrhea. No palliative measures for complaint.   Review of Systems   diabetes Objective:   Physical Exam HEENT is remarkable for edematous nasal turbinates bilateral erythematous TMs. Rhinorrhea is clear. Pharynx is mildly erythematous with copious mucous drainage tracks. Neck is supple without adenopathy. Lungs clear to auscultation with a nonproductive cough. Heart is regular rate and rhythm.       Assessment & Plan: Viral upper rest or infection.   Patient given discharge Instructions. Patient was take Allegra-D and Tussionex as directed. Patient advised follow-up PCP if no improvement 3-5 days.

## 2017-05-10 ENCOUNTER — Telehealth: Payer: Self-pay | Admitting: Emergency Medicine

## 2017-05-11 NOTE — Telephone Encounter (Signed)
Patient called and expressed that he continues to have a productive cough with chest congestion.  He wanted to know if the provider can call in a Zpack.  I advised Randel Pigg, PA-C and he authorized me to call in a Zpack in to [facility].  Patient has been informed.

## 2017-06-28 ENCOUNTER — Ambulatory Visit
Admission: RE | Admit: 2017-06-28 | Discharge: 2017-06-28 | Disposition: A | Discharge status: Home / Self Care | Payer: Managed Care, Other (non HMO) | Source: Ambulatory Visit | Attending: Internal Medicine | Admitting: Internal Medicine

## 2017-06-28 ENCOUNTER — Other Ambulatory Visit: Payer: Self-pay | Admitting: Internal Medicine

## 2017-06-28 DIAGNOSIS — I82401 Acute embolism and thrombosis of unspecified deep veins of right lower extremity: Secondary | ICD-10-CM

## 2017-06-28 DIAGNOSIS — R609 Edema, unspecified: Secondary | ICD-10-CM

## 2017-06-28 DIAGNOSIS — M79604 Pain in right leg: Secondary | ICD-10-CM

## 2017-07-02 ENCOUNTER — Other Ambulatory Visit: Payer: Self-pay

## 2017-07-02 DIAGNOSIS — E1165 Type 2 diabetes mellitus with hyperglycemia: Secondary | ICD-10-CM | POA: Diagnosis not present

## 2017-07-02 DIAGNOSIS — I8001 Phlebitis and thrombophlebitis of superficial vessels of right lower extremity: Secondary | ICD-10-CM | POA: Insufficient documentation

## 2017-07-02 DIAGNOSIS — Z7984 Long term (current) use of oral hypoglycemic drugs: Secondary | ICD-10-CM | POA: Diagnosis not present

## 2017-07-02 DIAGNOSIS — L03115 Cellulitis of right lower limb: Secondary | ICD-10-CM | POA: Diagnosis not present

## 2017-07-02 DIAGNOSIS — R2241 Localized swelling, mass and lump, right lower limb: Secondary | ICD-10-CM | POA: Diagnosis present

## 2017-07-02 DIAGNOSIS — Z79899 Other long term (current) drug therapy: Secondary | ICD-10-CM | POA: Diagnosis not present

## 2017-07-02 LAB — CBC WITH DIFFERENTIAL/PLATELET
Basophils Absolute: 0 10*3/uL (ref 0–0.1)
Basophils Relative: 1 %
EOS ABS: 0.1 10*3/uL (ref 0–0.7)
Eosinophils Relative: 2 %
HCT: 33.1 % — ABNORMAL LOW (ref 40.0–52.0)
HEMOGLOBIN: 11.9 g/dL — AB (ref 13.0–18.0)
LYMPHS ABS: 1.2 10*3/uL (ref 1.0–3.6)
Lymphocytes Relative: 19 %
MCH: 33.8 pg (ref 26.0–34.0)
MCHC: 35.8 g/dL (ref 32.0–36.0)
MCV: 94.3 fL (ref 80.0–100.0)
Monocytes Absolute: 0.9 10*3/uL (ref 0.2–1.0)
Monocytes Relative: 15 %
NEUTROS PCT: 63 %
Neutro Abs: 3.8 10*3/uL (ref 1.4–6.5)
Platelets: 56 10*3/uL — ABNORMAL LOW (ref 150–440)
RBC: 3.51 MIL/uL — AB (ref 4.40–5.90)
RDW: 14 % (ref 11.5–14.5)
WBC: 6.1 10*3/uL (ref 3.8–10.6)

## 2017-07-02 LAB — BASIC METABOLIC PANEL
ANION GAP: 11 (ref 5–15)
BUN: 21 mg/dL — ABNORMAL HIGH (ref 6–20)
CHLORIDE: 95 mmol/L — AB (ref 101–111)
CO2: 25 mmol/L (ref 22–32)
Calcium: 8.7 mg/dL — ABNORMAL LOW (ref 8.9–10.3)
Creatinine, Ser: 1.39 mg/dL — ABNORMAL HIGH (ref 0.61–1.24)
GFR calc Af Amer: >60 mL/min (ref >60)
GFR calc non Af Amer: 52 mL/min — ABNORMAL LOW (ref >60)
Glucose, Bld: 485 mg/dL — ABNORMAL HIGH (ref 65–99)
POTASSIUM: 3 mmol/L — AB (ref 3.5–5.1)
SODIUM: 131 mmol/L — AB (ref 135–145)

## 2017-07-02 NOTE — ED Triage Notes (Addendum)
Patient reports recently diagnosed with superficial blood clot on right lower leg that he was instructed to treat with heat at home.  Reports today started running fever of 103 at home.  Right mid calf with noted redness and warmth.

## 2017-07-03 ENCOUNTER — Emergency Department: Payer: Managed Care, Other (non HMO)

## 2017-07-03 ENCOUNTER — Emergency Department
Admission: EM | Admit: 2017-07-03 | Discharge: 2017-07-03 | Disposition: A | Discharge status: Home / Self Care | Payer: Managed Care, Other (non HMO) | Attending: Emergency Medicine | Admitting: Emergency Medicine

## 2017-07-03 DIAGNOSIS — R739 Hyperglycemia, unspecified: Secondary | ICD-10-CM

## 2017-07-03 DIAGNOSIS — L03115 Cellulitis of right lower limb: Secondary | ICD-10-CM

## 2017-07-03 DIAGNOSIS — M7989 Other specified soft tissue disorders: Secondary | ICD-10-CM

## 2017-07-03 DIAGNOSIS — I8001 Phlebitis and thrombophlebitis of superficial vessels of right lower extremity: Secondary | ICD-10-CM

## 2017-07-03 LAB — URINALYSIS, COMPLETE (UACMP) WITH MICROSCOPIC
BACTERIA UA: NONE SEEN
BILIRUBIN URINE: NEGATIVE
GLUCOSE, UA: 50 mg/dL — AB
HGB URINE DIPSTICK: NEGATIVE
Ketones, ur: NEGATIVE mg/dL
Leukocytes, UA: NEGATIVE
NITRITE: NEGATIVE
PH: 5 (ref 5.0–8.0)
PROTEIN: NEGATIVE mg/dL
Specific Gravity, Urine: 1.016 (ref 1.005–1.030)

## 2017-07-03 LAB — INFLUENZA PANEL BY PCR (TYPE A & B)
INFLAPCR: NEGATIVE
Influenza B By PCR: NEGATIVE

## 2017-07-03 LAB — GLUCOSE, CAPILLARY
Glucose-Capillary: 290 mg/dL — ABNORMAL HIGH (ref 65–99)
Glucose-Capillary: 269 mg/dL — ABNORMAL HIGH (ref 65–99)

## 2017-07-03 LAB — LACTIC ACID, PLASMA: Lactic Acid, Venous: 1.4 mmol/L (ref 0.5–1.9)

## 2017-07-03 MED ORDER — MORPHINE SULFATE (PF) 4 MG/ML IV SOLN
4.0000 mg | Freq: Once | INTRAVENOUS | Status: AC
  Administered 2017-07-03: 4 mg via INTRAVENOUS
  Filled 2017-07-03: qty 1

## 2017-07-03 MED ORDER — INSULIN ASPART 100 UNIT/ML ~~LOC~~ SOLN
10.0000 [IU] | Freq: Once | SUBCUTANEOUS | Status: AC
  Administered 2017-07-03: 6 [IU] via INTRAVENOUS
  Filled 2017-07-03: qty 1

## 2017-07-03 MED ORDER — POTASSIUM CHLORIDE CRYS ER 20 MEQ PO TBCR
40.0000 meq | EXTENDED_RELEASE_TABLET | Freq: Once | ORAL | Status: AC
  Administered 2017-07-03: 40 meq via ORAL
  Filled 2017-07-03: qty 2

## 2017-07-03 MED ORDER — ONDANSETRON HCL 4 MG/2ML IJ SOLN
4.0000 mg | Freq: Once | INTRAMUSCULAR | Status: AC
  Administered 2017-07-03: 4 mg via INTRAVENOUS
  Filled 2017-07-03: qty 2

## 2017-07-03 MED ORDER — OXYCODONE-ACETAMINOPHEN 5-325 MG PO TABS: 1.0000 | ORAL_TABLET | ORAL | 0 refills | Status: DC | PRN

## 2017-07-03 MED ORDER — CEPHALEXIN 500 MG PO CAPS: 500.0000 mg | ORAL_CAPSULE | Freq: Three times a day (TID) | ORAL | 0 refills | Status: DC

## 2017-07-03 MED ORDER — DEXTROSE 5 % IV SOLN
1.0000 g | INTRAVENOUS | Status: DC
  Administered 2017-07-03: 1 g via INTRAVENOUS
  Filled 2017-07-03: qty 10

## 2017-07-03 MED ORDER — SODIUM CHLORIDE 0.9 % IV BOLUS (SEPSIS)
500.0000 mL | Freq: Once | INTRAVENOUS | Status: AC
  Administered 2017-07-03: 500 mL via INTRAVENOUS

## 2017-07-03 NOTE — ED Provider Notes (Signed)
-----------------------------------------   9:32 AM on 07/03/2017 -----------------------------------------  Patient care assumed from Dr. Beather Arbour.  Patient's workup is largely been nonrevealing.  Lactate of 1.4, influenza test is negative, white blood cell count is normal.  Patient's blood glucose initially elevated now down to 290.  Was given IV insulin and IV fluids.  I discussed the results thus far with the patient, normal workup including chest x-ray, unchanged lower extremity occlusive thrombus.  Urinalysis pending, patient is attempting to give urine sample at this time.  If urinalysis is normal we will likely discharge home with a short course of pain medication, antibiotics to cover for possible infection in the lower extremity and have the patient follow-up with his doctor.  Patient and wife are agreeable to this plan of care.   Urinalysis has resulted within normal limits.  Patient appears well.  We will discharge home with Keflex, short course of pain medication.  Patient agreeable this plan of care.  Patient will follow up with his primary care doctor.  I discussed return precautions.   Harvest Dark, MD 07/03/17 1034

## 2017-07-03 NOTE — ED Provider Notes (Signed)
[facility] Emergency Department Provider Note   ____________________________________________   First MD Initiated Contact with Patient 07/03/17 [phone]     (approximate)  I have reviewed the triage vital signs and the nursing notes.   HISTORY  Chief Complaint Fever and Leg Swelling    HPI Nathan Schultz is a 62 y.o. male who presents to the ED from home with a chief complaint pain and swelling to his right lower leg.  Patient had been seeing his PCP for same; initially to have cellulitis and was treated with IM Rocephin in the office and Levaquin as an outpatient.  Subsequently the patient began to feel a hard knot in his leg.  Ultrasound on 1/29 revealed partially occlusive superficial vein thrombosis within a segment of the greater saphenous vein in the calf.  He was told to apply warm compresses.  He called his doctor back in a couple days later for increased swelling of his leg.  In addition to his torsemide, his PCP had him start 4 doses of Zaroxolyn; starting his first dose yesterday. Patient does tell me that he went to work and he stands on his feet for long periods of time and walks.  Complains of increased swelling, pain and now area is starting to become red.  Reports temperature of  101F at 7 PM.  Wife gave Motrin prior to arrival.  Denies chest pain, shortness of breath, abdominal pain, nausea, vomiting dysuria.  Denies recent travel or trauma.   Past medical history History of ITP Diabetes type 2 Encephalopathy  Patient Active Problem List   Diagnosis Date Noted  . History of immune thrombocytopenia 05/29/2015  . Controlled type 2 diabetes mellitus without complication ([place]) 34/19/3790  . Hepatic encephalopathy ([place]) 10/02/2013  . 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 Authorizing Provider  benzonatate (TESSALON PERLES) 100 MG capsule Take 2 capsules (200 mg  total) by mouth 3 (three) times daily as needed for cough. 05/03/17   Sable Feil, PA-C  bifidobacterium infantis (ALIGN) capsule Take 1 capsule by mouth daily. 05/08/15   Fisher, Linden Dolin, PA-C  fexofenadine-pseudoephedrine (ALLEGRA-D) 60-120 MG 12 hr tablet Take 1 tablet by mouth 2 (two) times daily. 05/03/17   Sable Feil, PA-C  glimepiride (AMARYL) 2 MG tablet Take by mouth. 04/24/14   [provider]  glucose blood (ONE TOUCH ULTRA TEST) test strip USE THREE TIMES DAILY 05/06/14   [provider]  Magnesium 200 MG TABS Take by mouth.    [provider]  Jonetta Speak LANCETS 24O MISC AS DIRECTED 05/06/14   [provider]  potassium chloride SA (K-DUR,KLOR-CON) 20 MEQ tablet Take by mouth. 04/24/14   [provider]  sitaGLIPtin-metformin (JANUMET) 50-1000 MG tablet TAKE ONE TABLET BY MOUTH TWICE DAILY WITH MEALS 05/05/15   [provider]  torsemide (DEMADEX) 20 MG tablet Take by mouth. 04/24/14   [provider]    Allergies Clindamycin; Doxycycline; Triamterene-hctz; and Vancomycin  No family history on file.  Social History Social History   Tobacco Use  . Smoking status: Never Smoker  . Smokeless tobacco: Never Used  Substance Use Topics  . Alcohol use: Not on file  . Drug use: Not on file    Review of Systems  Constitutional: Positive for fever/chills. Eyes: No visual changes. ENT: No sore throat. Cardiovascular: Denies chest pain. Respiratory: Denies shortness of breath. Gastrointestinal: No abdominal  pain.  No nausea, no vomiting.  No diarrhea.  No constipation. Genitourinary: Negative for dysuria. Musculoskeletal: Positive for right lower leg swelling, redness and pain.  Negative for back pain. Skin: Negative for rash. Neurological: Negative for headaches, focal weakness or numbness.   ____________________________________________   PHYSICAL EXAM:  VITAL SIGNS: ED Triage Vitals  Enc Vitals  Group     BP 07/02/17 2227 134/62     Pulse Rate 07/02/17 2227 87     Resp 07/02/17 2227 16     Temp 07/02/17 2227 98.4 F (36.9 C)     Temp Source 07/02/17 2227 Oral     SpO2 07/02/17 2227 96 %     Weight 07/02/17 2227 220 lb (99.8 kg)     Height 07/02/17 2227 5' 9"  (1.753 m)     Head Circumference --      Peak Flow --      Pain Score 07/02/17 2231 7     Pain Loc --      Pain Edu? --      Excl. in [place]? --     Constitutional: Alert and oriented. Well appearing and in no acute distress. Eyes: Conjunctivae are normal. PERRL. EOMI. Head: Atraumatic. Nose: No congestion/rhinnorhea. Mouth/Throat: Mucous membranes are moist.  Oropharynx non-erythematous. Neck: No stridor.  Supple neck without meningismus. Cardiovascular: Normal rate, regular rhythm. Grossly normal heart sounds.  Good peripheral circulation. Respiratory: Normal respiratory effort.  No retractions. Lungs CTAB. Gastrointestinal: Soft and nontender. No distention. No abdominal bruits. No CVA tenderness. Musculoskeletal:  RLE: Compression stocking removed for examination.  With moderate swelling.  Area of redness to medial calf which is tender to palpation.  Area is warm to touch.  2+ femoral and distal pulses.  Brisk, less than 5-second capillary refill. Neurologic:  Normal speech and language. No gross focal neurologic deficits are appreciated.  Skin:  Skin is warm, dry and intact. No rash noted. Psychiatric: Mood and affect are normal. Speech and behavior are normal.  ____________________________________________   LABS (all labs ordered are listed, but only abnormal results are displayed)  Labs Reviewed  CBC WITH DIFFERENTIAL/PLATELET - Abnormal; Notable for the following components:      Result Value   RBC 3.51 (*)    Hemoglobin 11.9 (*)    HCT 33.1 (*)    Platelets 56 (*)    All other components within normal limits  BASIC METABOLIC PANEL - Abnormal; Notable for the following components:   Sodium 131 (*)     Potassium 3.0 (*)    Chloride 95 (*)    Glucose, Bld 485 (*)    BUN 21 (*)    Creatinine, Ser 1.39 (*)    Calcium 8.7 (*)    GFR calc non Af Amer 52 (*)    All other components within normal limits  CULTURE, BLOOD (ROUTINE X 2)  CULTURE, BLOOD (ROUTINE X 2)  INFLUENZA PANEL BY PCR (TYPE A & B)  LACTIC ACID, PLASMA  LACTIC ACID, PLASMA  URINALYSIS, COMPLETE (UACMP) WITH MICROSCOPIC   ____________________________________________  EKG  None ____________________________________________  RADIOLOGY  ED MD interpretation: No DVT; unchanged superficial thrombus phlebitis compared to 1/29 ultrasound  Official radiology report(s): US Venous Img Lower Unilateral Right  Result Date: 07/03/2017 CLINICAL DATA:  Known right SVT with persistent calf edema. Evaluate for DVT and/or acute or chronic SVT. EXAM: RIGHT LOWER EXTREMITY VENOUS DOPPLER ULTRASOUND TECHNIQUE: Gray-scale sonography with graded compression, as well as color Doppler and duplex ultrasound were performed to evaluate  the lower extremity deep venous systems from the level of the common femoral vein and including the common femoral, femoral, profunda femoral, popliteal and calf veins including the posterior tibial, peroneal and gastrocnemius veins when visible. The superficial great saphenous vein was also interrogated. Spectral Doppler was utilized to evaluate flow at rest and with distal augmentation maneuvers in the common femoral, femoral and popliteal veins. COMPARISON:  Left lower extremity venous Doppler ultrasound - 06/28/2017 FINDINGS: Contralateral Common Femoral Vein: Respiratory phasicity is normal and symmetric with the symptomatic side. No evidence of thrombus. Normal compressibility. Common Femoral Vein: No evidence of thrombus. Normal compressibility, respiratory phasicity and response to augmentation. Saphenofemoral Junction: No evidence of thrombus. Normal compressibility and flow on color Doppler imaging. Profunda  Femoral Vein: No evidence of thrombus. Normal compressibility and flow on color Doppler imaging. Femoral Vein: No evidence of thrombus. Normal compressibility, respiratory phasicity and response to augmentation. Popliteal Vein: No evidence of thrombus. Normal compressibility, respiratory phasicity and response to augmentation. Calf Veins: No evidence of thrombus. Normal compressibility and flow on color Doppler imaging. Superficial Great Saphenous Vein: There is mixed echogenic near occlusive thrombus within a distal aspect the greater saphenous vein corresponding to the patient's area of pain and redness within the medial aspect of the right calf (images 36 through 41), morphologically unchanged since the 06/28/2017 examination Venous Reflux:  None. Other Findings:  None IMPRESSION: 1. No evidence of DVT within right lower extremity. 2. 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 Electronically Signed   By: Sandi Mariscal M.D.   On: 07/03/2017 04:14   Dg Chest Port 1 View  Result Date: 07/03/2017 CLINICAL DATA:  Blood clot and lower right leg. Fever since last night. No chest pain, shortness of breath, or cough. Nonsmoker. EXAM: PORTABLE CHEST 1 VIEW COMPARISON:  12/10/2013 FINDINGS: Shallow inspiration. Heart size and pulmonary vascularity are normal. Lungs are clear and expanded. No blunting of costophrenic angles. No pneumothorax. Mediastinal contours appear intact. Postoperative changes in the cervical spine and right shoulder. IMPRESSION: No active disease. Electronically Signed   By: Lucienne Capers M.D.   On: 07/03/2017 06:33    ____________________________________________   PROCEDURES  Procedure(s) performed: None  Procedures  Critical Care performed: No  ____________________________________________   INITIAL IMPRESSION / ASSESSMENT AND  PLAN / ED COURSE  As part of my medical decision making, I reviewed the following data within the [place] History obtained from family, Nursing notes reviewed and incorporated, Labs reviewed, Old chart reviewed and Notes from prior ED visits.   62 year old male with right lower leg swelling, pain and redness.  Differential diagnosis includes but is not limited to cellulitis, DVT, sepsis, bleeding dyscrasia, metabolic etiology, etc.  Laboratory results notable for hypokalemia, hyperglycemia, renal insufficiency.  Platelet count is stable.  Influenza is negative.  Ultrasound unchanged from 1/29.  Given patient's complaint of fever and increased redness/warmth, will obtain blood cultures, lactic acid, administer IV Rocephin and reassess.  Clinical Course as of Jul 04 707  Sun Jul 03, 2017  0708 Care transferred to Dr. Kerman Passey pending results of lactate and urinalysis.  If those are within normal limits, anticipate discharge home on Keflex.  Will add insulin for hyperglycemia.   [JS]    Clinical Course User Index [JS] Paulette Blanch, MD     ____________________________________________   FINAL CLINICAL IMPRESSION(S) / ED DIAGNOSES  Final diagnoses:  Cellulitis of right lower extremity  Leg swelling  Thrombophlebitis of superficial veins of right lower extremity  Hyperglycemia     ED Discharge Orders    None       Note:  This document was prepared using Dragon voice recognition software and may include unintentional dictation errors.    Paulette Blanch, MD 07/03/17 [phone]

## 2017-07-03 NOTE — ED Notes (Signed)
Patient verbalizes understanding of d/c instructions, medications, and follow-up. Vital signs stable and pain controlled per pt. Patient In Not in Acute Distress at time of discharge and denies further concerns regarding this visit. Patient stable at the time of departure from the unit, departing unit by the safest and most appropriate manner per patient condition and limitations with all belongings accounted for. Patient advised to return to the ED at any time for emergent concerns, or for new/worsening symptoms.

## 2017-07-03 NOTE — Discharge Instructions (Signed)
1.  Take antibiotic as prescribed (Keflex 500 mg 3 times daily for 7 days). 2.  You may take pain medicine as needed (Percocet #15). 3.  Elevate affected area as much as possible. 4.  Return to the ER for worsening symptoms, persistent vomiting, difficulty breathing or other concerns.

## 2017-07-03 NOTE — ED Notes (Signed)
Patient reports he was seen earlier in the week for possible cellulitis of right leg. Patient had ultrasound performed and was prescribed levaquin and a second antibiotic to treat cellulitis. Patient reports hx of multiple case of cellulitis.   Patient reports increasing pain, swelling and redness to right leg throughout the week.   Patient's right medial calf is swollen, reddned and warm to touch.

## 2017-07-04 LAB — URINE CULTURE: Culture: NO GROWTH

## 2017-07-05 ENCOUNTER — Encounter: Payer: Self-pay | Admitting: Emergency Medicine

## 2017-07-05 ENCOUNTER — Other Ambulatory Visit: Payer: Self-pay

## 2017-07-05 ENCOUNTER — Inpatient Hospital Stay
Admission: EM | Admit: 2017-07-05 | Discharge: 2017-07-07 | DRG: 603 | Disposition: A | Discharge status: Home / Self Care | Payer: Managed Care, Other (non HMO) | Attending: Internal Medicine | Admitting: Internal Medicine

## 2017-07-05 ENCOUNTER — Emergency Department: Payer: Managed Care, Other (non HMO)

## 2017-07-05 DIAGNOSIS — Z888 Allergy status to other drugs, medicaments and biological substances status: Secondary | ICD-10-CM | POA: Diagnosis not present

## 2017-07-05 DIAGNOSIS — E872 Acidosis, unspecified: Secondary | ICD-10-CM

## 2017-07-05 DIAGNOSIS — Z881 Allergy status to other antibiotic agents status: Secondary | ICD-10-CM

## 2017-07-05 DIAGNOSIS — Z79899 Other long term (current) drug therapy: Secondary | ICD-10-CM | POA: Diagnosis not present

## 2017-07-05 DIAGNOSIS — E871 Hypo-osmolality and hyponatremia: Secondary | ICD-10-CM | POA: Diagnosis present

## 2017-07-05 DIAGNOSIS — N179 Acute kidney failure, unspecified: Secondary | ICD-10-CM | POA: Diagnosis present

## 2017-07-05 DIAGNOSIS — Z7984 Long term (current) use of oral hypoglycemic drugs: Secondary | ICD-10-CM

## 2017-07-05 DIAGNOSIS — E876 Hypokalemia: Secondary | ICD-10-CM | POA: Diagnosis present

## 2017-07-05 DIAGNOSIS — L02415 Cutaneous abscess of right lower limb: Secondary | ICD-10-CM | POA: Diagnosis present

## 2017-07-05 DIAGNOSIS — K7460 Unspecified cirrhosis of liver: Secondary | ICD-10-CM | POA: Diagnosis present

## 2017-07-05 DIAGNOSIS — G4733 Obstructive sleep apnea (adult) (pediatric): Secondary | ICD-10-CM | POA: Diagnosis present

## 2017-07-05 DIAGNOSIS — E1122 Type 2 diabetes mellitus with diabetic chronic kidney disease: Secondary | ICD-10-CM | POA: Diagnosis present

## 2017-07-05 DIAGNOSIS — D693 Immune thrombocytopenic purpura: Secondary | ICD-10-CM | POA: Diagnosis present

## 2017-07-05 DIAGNOSIS — R609 Edema, unspecified: Secondary | ICD-10-CM

## 2017-07-05 DIAGNOSIS — K7581 Nonalcoholic steatohepatitis (NASH): Secondary | ICD-10-CM | POA: Diagnosis present

## 2017-07-05 DIAGNOSIS — N183 Chronic kidney disease, stage 3 (moderate): Secondary | ICD-10-CM | POA: Diagnosis present

## 2017-07-05 DIAGNOSIS — L03115 Cellulitis of right lower limb: Principal | ICD-10-CM

## 2017-07-05 DIAGNOSIS — A419 Sepsis, unspecified organism: Secondary | ICD-10-CM

## 2017-07-05 HISTORY — DX: Type 2 diabetes mellitus without complications: E11.9

## 2017-07-05 HISTORY — DX: Other cirrhosis of liver: K74.69

## 2017-07-05 HISTORY — DX: Personal history of diseases of the blood and blood-forming organs and certain disorders involving the immune mechanism: Z86.2

## 2017-07-05 HISTORY — DX: Nonalcoholic steatohepatitis (NASH): K75.81

## 2017-07-05 HISTORY — DX: Obstructive sleep apnea (adult) (pediatric): G47.33

## 2017-07-05 HISTORY — DX: Unspecified cirrhosis of liver: K74.60

## 2017-07-05 LAB — URINALYSIS, COMPLETE (UACMP) WITH MICROSCOPIC
BILIRUBIN URINE: NEGATIVE
Bacteria, UA: NONE SEEN
HGB URINE DIPSTICK: NEGATIVE
Ketones, ur: NEGATIVE mg/dL
LEUKOCYTES UA: NEGATIVE
NITRITE: NEGATIVE
PH: 5 (ref 5.0–8.0)
Protein, ur: NEGATIVE mg/dL
SPECIFIC GRAVITY, URINE: 1.015 (ref 1.005–1.030)
Squamous Epithelial / LPF: NONE SEEN

## 2017-07-05 LAB — COMPREHENSIVE METABOLIC PANEL
ALT: 27 U/L (ref 17–63)
ANION GAP: 13 (ref 5–15)
AST: 54 U/L — ABNORMAL HIGH (ref 15–41)
Albumin: 2.9 g/dL — ABNORMAL LOW (ref 3.5–5.0)
Alkaline Phosphatase: 112 U/L (ref 38–126)
BILIRUBIN TOTAL: 3.3 mg/dL — AB (ref 0.3–1.2)
BUN: 45 mg/dL — AB (ref 6–20)
CO2: 24 mmol/L (ref 22–32)
Calcium: 7.9 mg/dL — ABNORMAL LOW (ref 8.9–10.3)
Chloride: 92 mmol/L — ABNORMAL LOW (ref 101–111)
Creatinine, Ser: 2.05 mg/dL — ABNORMAL HIGH (ref 0.61–1.24)
GFR calc Af Amer: 38 mL/min — ABNORMAL LOW (ref >60)
GFR, EST NON AFRICAN AMERICAN: 33 mL/min — AB (ref >60)
Glucose, Bld: 490 mg/dL — ABNORMAL HIGH (ref 65–99)
POTASSIUM: 4.1 mmol/L (ref 3.5–5.1)
Sodium: 129 mmol/L — ABNORMAL LOW (ref 135–145)
TOTAL PROTEIN: 7.6 g/dL (ref 6.5–8.1)

## 2017-07-05 LAB — CBC WITH DIFFERENTIAL/PLATELET
BASOS ABS: 0 10*3/uL (ref 0–0.1)
BASOS PCT: 0 %
EOS PCT: 0 %
Eosinophils Absolute: 0 10*3/uL (ref 0–0.7)
HCT: 36.8 % — ABNORMAL LOW (ref 40.0–52.0)
Hemoglobin: 13.1 g/dL (ref 13.0–18.0)
LYMPHS PCT: 3 %
Lymphs Abs: 0.4 10*3/uL — ABNORMAL LOW (ref 1.0–3.6)
MCH: 34 pg (ref 26.0–34.0)
MCHC: 35.6 g/dL (ref 32.0–36.0)
MCV: 95.4 fL (ref 80.0–100.0)
Monocytes Absolute: 1 10*3/uL (ref 0.2–1.0)
Monocytes Relative: 7 %
NEUTROS ABS: 13.6 10*3/uL — AB (ref 1.4–6.5)
Neutrophils Relative %: 90 %
PLATELETS: 82 10*3/uL — AB (ref 150–440)
RBC: 3.86 MIL/uL — AB (ref 4.40–5.90)
RDW: 14 % (ref 11.5–14.5)
WBC: 15 10*3/uL — AB (ref 3.8–10.6)

## 2017-07-05 LAB — LACTIC ACID, PLASMA
Lactic Acid, Venous: 4.1 mmol/L (ref 0.5–1.9)
Lactic Acid, Venous: 2.1 mmol/L (ref 0.5–1.9)

## 2017-07-05 LAB — HEMOGLOBIN A1C
Hgb A1c MFr Bld: 8.6 % — ABNORMAL HIGH (ref 4.8–5.6)
Mean Plasma Glucose: 200.12 mg/dL

## 2017-07-05 LAB — GLUCOSE, CAPILLARY: Glucose-Capillary: 221 mg/dL — ABNORMAL HIGH (ref 65–99)

## 2017-07-05 LAB — CK: Total CK: 221 U/L (ref 49–397)

## 2017-07-05 MED ORDER — INSULIN ASPART 100 UNIT/ML ~~LOC~~ SOLN
0.0000 [IU] | Freq: Three times a day (TID) | SUBCUTANEOUS | Status: DC
  Administered 2017-07-06: 3 [IU] via SUBCUTANEOUS
  Administered 2017-07-06 – 2017-07-07 (×4): 5 [IU] via SUBCUTANEOUS
  Filled 2017-07-05 (×5): qty 1

## 2017-07-05 MED ORDER — ONDANSETRON HCL 4 MG PO TABS: 4.0000 mg | ORAL_TABLET | Freq: Four times a day (QID) | ORAL | Status: DC | PRN

## 2017-07-05 MED ORDER — SODIUM CHLORIDE 0.9 % IV BOLUS (SEPSIS): 500.0000 mL | Freq: Once | INTRAVENOUS | Status: DC

## 2017-07-05 MED ORDER — INSULIN ASPART 100 UNIT/ML ~~LOC~~ SOLN
15.0000 [IU] | SUBCUTANEOUS | Status: AC
  Administered 2017-07-05: 15 [IU] via SUBCUTANEOUS
  Filled 2017-07-05: qty 1

## 2017-07-05 MED ORDER — ACETAMINOPHEN 650 MG RE SUPP: 650.0000 mg | Freq: Four times a day (QID) | RECTAL | Status: DC | PRN

## 2017-07-05 MED ORDER — SODIUM CHLORIDE 0.9 % IV BOLUS (SEPSIS)
30.0000 mL/kg | Freq: Once | INTRAVENOUS | Status: AC
  Administered 2017-07-05: 2994 mL via INTRAVENOUS

## 2017-07-05 MED ORDER — MORPHINE SULFATE (PF) 4 MG/ML IV SOLN
4.0000 mg | Freq: Once | INTRAVENOUS | Status: AC
  Administered 2017-07-05: 4 mg via INTRAVENOUS
  Filled 2017-07-05: qty 1

## 2017-07-05 MED ORDER — ONDANSETRON HCL 4 MG/2ML IJ SOLN
4.0000 mg | Freq: Four times a day (QID) | INTRAMUSCULAR | Status: DC | PRN
  Administered 2017-07-07: 4 mg via INTRAVENOUS
  Filled 2017-07-05: qty 2

## 2017-07-05 MED ORDER — PIPERACILLIN-TAZOBACTAM 3.375 G IVPB 30 MIN
3.3750 g | Freq: Once | INTRAVENOUS | Status: AC
  Administered 2017-07-05: 3.375 g via INTRAVENOUS
  Filled 2017-07-05: qty 50

## 2017-07-05 MED ORDER — ONDANSETRON HCL 4 MG/2ML IJ SOLN
4.0000 mg | Freq: Once | INTRAMUSCULAR | Status: AC
  Administered 2017-07-05: 4 mg via INTRAVENOUS
  Filled 2017-07-05: qty 2

## 2017-07-05 MED ORDER — POLYETHYLENE GLYCOL 3350 17 G PO PACK: 17.0000 g | PACK | Freq: Every day | ORAL | Status: DC | PRN

## 2017-07-05 MED ORDER — ALBUTEROL SULFATE (2.5 MG/3ML) 0.083% IN NEBU: 2.5000 mg | INHALATION_SOLUTION | RESPIRATORY_TRACT | Status: DC | PRN

## 2017-07-05 MED ORDER — OXYCODONE HCL 5 MG PO TABS
5.0000 mg | ORAL_TABLET | ORAL | Status: DC | PRN
  Administered 2017-07-05 – 2017-07-06 (×3): 5 mg via ORAL
  Filled 2017-07-05 (×3): qty 1

## 2017-07-05 MED ORDER — PIPERACILLIN-TAZOBACTAM 3.375 G IVPB
3.3750 g | Freq: Three times a day (TID) | INTRAVENOUS | Status: DC
  Administered 2017-07-05 – 2017-07-07 (×6): 3.375 g via INTRAVENOUS
  Filled 2017-07-05 (×7): qty 50

## 2017-07-05 MED ORDER — SODIUM CHLORIDE 0.9 % IV BOLUS (SEPSIS): 1000.0000 mL | Freq: Once | INTRAVENOUS | Status: DC

## 2017-07-05 MED ORDER — ACETAMINOPHEN 325 MG PO TABS: 650.0000 mg | ORAL_TABLET | Freq: Four times a day (QID) | ORAL | Status: DC | PRN

## 2017-07-05 MED ORDER — SULFAMETHOXAZOLE-TRIMETHOPRIM 800-160 MG PO TABS
1.0000 | ORAL_TABLET | Freq: Once | ORAL | Status: AC
  Administered 2017-07-05: 1 via ORAL
  Filled 2017-07-05: qty 1

## 2017-07-05 MED ORDER — INSULIN ASPART 100 UNIT/ML ~~LOC~~ SOLN
0.0000 [IU] | Freq: Every day | SUBCUTANEOUS | Status: DC
  Administered 2017-07-05 – 2017-07-06 (×2): 2 [IU] via SUBCUTANEOUS
  Filled 2017-07-05 (×2): qty 1

## 2017-07-05 NOTE — Progress Notes (Signed)
CODE SEPSIS - PHARMACY COMMUNICATION  **Broad Spectrum Antibiotics should be administered within 1 hour of Sepsis diagnosis**  Time Code Sepsis Called/Page Received: 2/5 @1547   Antibiotics Ordered: Zosyn 3.375 IV                                      Bactrim DS PO   Time of 1st antibiotic administration: 2/5 @ 1623  Additional action taken by pharmacy: N/A  If necessary, Name of Provider/Nurse Contacted: N/A  Pernell Dupre, PharmD, BCPS Clinical Pharmacist 07/05/2017 4:24 PM

## 2017-07-05 NOTE — ED Notes (Signed)
Attempted to call report; RN unavailable at this time.

## 2017-07-05 NOTE — ED Notes (Signed)
Patient transported to X-ray 

## 2017-07-05 NOTE — H&P (Signed)
[facility] [place] NAME: Nathan Schultz    MR#:  [id number]  DATE OF BIRTH:  [birth year]  DATE OF ADMISSION:  07/05/2017  PRIMARY CARE PHYSICIAN: Rusty Aus, MD   REQUESTING/REFERRING PHYSICIAN: Dr. Mable Paris  CHIEF COMPLAINT:   Chief Complaint  Patient presents with  . Blood Infection   HISTORY OF PRESENT ILLNESS:  Nathan Schultz  is a 62 y.o. male with a known history of diabetes mellitus, chronic ITP presents to the emergency room due to worsening right leg cellulitis.  Patient has had this for 2 weeks.  Initially thought to be a bruise progressing to cellulitis.  Was given i.m. ceftriaxone and his primary care physician's office and then started on Levaquin at home.  No improvement.  Hot compress applied.  Later started on Keflex.  Today patient arrives to the ER with worsening pain, redness and swelling.  Lactic acid 4.7 with elevated WBC.  Afebrile.  Also has acute kidney injury.  PAST MEDICAL HISTORY:   Past Medical History:  Diagnosis Date  . Diabetes mellitus without complication ([place])   . History of ITP   . Liver cirrhosis secondary to nonalcoholic steatohepatitis (NASH) ([place])   . OSA (obstructive sleep apnea)     PAST SURGICAL HISTORY:   Past Surgical History:  Procedure Laterality Date  . CERVICAL FUSION    . CHOLECYSTECTOMY    . TONSILLECTOMY      SOCIAL HISTORY:   Social History   Tobacco Use  . Smoking status: Never Smoker  . Smokeless tobacco: Never Used  Substance Use Topics  . Alcohol use: No    Alcohol/week: 0.0 oz    Frequency: Never    FAMILY HISTORY:   Family History  Problem Relation Age of Onset  . CAD Father   . Diabetes Brother     DRUG ALLERGIES:   Allergies  Allergen Reactions  . Clindamycin Other (See Comments)    Platelet allergy  . Doxycycline Other (See Comments)    Platelet allergy  . Triamterene-Hctz Other (See Comments)    Low platelets  . Vancomycin Other (See  Comments)    Platelet allergy    REVIEW OF SYSTEMS:   Review of Systems  Constitutional: Positive for chills and malaise/fatigue. Negative for fever and weight loss.  HENT: Negative for hearing loss and nosebleeds.   Eyes: Negative for blurred vision, double vision and pain.  Respiratory: Negative for cough, hemoptysis, sputum production, shortness of breath and wheezing.   Cardiovascular: Negative for chest pain, palpitations, orthopnea and leg swelling.  Gastrointestinal: Negative for abdominal pain, constipation, diarrhea, nausea and vomiting.  Genitourinary: Negative for dysuria and hematuria.  Musculoskeletal: Negative for back pain, falls and myalgias.  Skin: Positive for rash.  Neurological: Positive for weakness. Negative for dizziness, tremors, sensory change, speech change, focal weakness, seizures and headaches.  Endo/Heme/Allergies: Does not bruise/bleed easily.  Psychiatric/Behavioral: Negative for depression and memory loss. The patient is not nervous/anxious.     MEDICATIONS AT HOME:   Prior to Admission medications   Medication Sig Start Date End Date Taking? Authorizing Provider  benzonatate (TESSALON PERLES) 100 MG capsule Take 2 capsules (200 mg total) by mouth 3 (three) times daily as needed for cough. Patient not taking: Reported on 07/03/2017 05/03/17   Sable Feil, PA-C  bifidobacterium infantis (ALIGN) capsule Take 1 capsule by mouth daily. 05/08/15   Fisher, Linden Dolin, PA-C  cephALEXin (KEFLEX) 500 MG capsule Take 1 capsule (500 mg total)  by mouth 3 (three) times daily. 07/03/17   Paulette Blanch, MD  escitalopram (LEXAPRO) 5 MG tablet Take 5 mg by mouth daily.    [provider]  fexofenadine-pseudoephedrine (ALLEGRA-D) 60-120 MG 12 hr tablet Take 1 tablet by mouth 2 (two) times daily. Patient not taking: Reported on 07/03/2017 05/03/17   Sable Feil, PA-C  glimepiride (AMARYL) 2 MG tablet Take 6 mg by mouth daily with breakfast.  04/24/14   [provider]  glucose blood (ONE TOUCH ULTRA TEST) test strip USE THREE TIMES DAILY 05/06/14   [provider]  lactulose, encephalopathy, (CHRONULAC) 10 GM/15ML SOLN Take 10 g by mouth 2 (two) times daily.    [provider]  liraglutide (VICTOZA) 18 MG/3ML SOPN Inject 1.2 mg into the skin daily.    [provider]  loratadine (CLARITIN) 10 MG tablet Take 10 mg by mouth daily.    [provider]  Magnesium 400 MG TABS Take 1 tablet by mouth daily.     [provider]  metFORMIN (GLUCOPHAGE-XR) 500 MG 24 hr tablet Take 500 mg by mouth 2 (two) times daily.    [provider]  metolazone (ZAROXOLYN) 2.5 MG tablet Take 2.5 mg by mouth as directed.    [provider]  neomycin (MYCIFRADIN) 500 MG tablet Take 1,000 mg by mouth 2 (two) times daily.    [provider]  Jonetta Speak LANCETS 38H MISC AS DIRECTED 05/06/14   [provider]  oxyCODONE-acetaminophen (PERCOCET/ROXICET) 5-325 MG tablet Take 1 tablet by mouth every 4 (four) hours as needed for severe pain. 07/03/17   Paulette Blanch, MD  potassium chloride SA (K-DUR,KLOR-CON) 20 MEQ tablet Take 40 mEq by mouth 3 (three) times daily.     [provider]  torsemide (DEMADEX) 20 MG tablet Take 30 mg by mouth daily.     [provider]     VITAL SIGNS:  Blood pressure (!) 117/57, pulse 97, temperature 98.9 F (37.2 C), temperature source Oral, resp. rate 18, height 5' 9"  (1.753 m), weight 99.8 kg (220 lb), SpO2 95 %.  PHYSICAL EXAMINATION:  Physical Exam  GENERAL:  62 y.o.-year-old patient lying in the bed with no acute distress.  EYES: Pupils equal, round, reactive to light and accommodation. No scleral icterus. Extraocular muscles intact.  HEENT: Head atraumatic, normocephalic. Oropharynx and nasopharynx clear. No oropharyngeal erythema, moist oral mucosa  NECK:  Supple, no jugular venous distention. No thyroid enlargement, no tenderness.   LUNGS: Normal breath sounds bilaterally, no wheezing, rales, rhonchi. No use of accessory muscles of respiration.  CARDIOVASCULAR: S1, S2 normal. No murmurs, rubs, or gallops.  ABDOMEN: Soft, nontender, nondistended. Bowel sounds present. No organomegaly or mass.  EXTREMITIES: Right calf redness and swelling 12 x 10 cm.  Area of fluctuance with likely abscess.  No discharge. NEUROLOGIC: Cranial nerves II through XII are intact. No focal Motor or sensory deficits appreciated b/l PSYCHIATRIC: The patient is alert and oriented x 3. Good affect.  SKIN: No obvious rash, lesion, or ulcer.   LABORATORY PANEL:   CBC Recent Labs  Lab 07/05/17 1444  WBC 15.0*  HGB 13.1  HCT 36.8*  PLT 82*   ------------------------------------------------------------------------------------------------------------------  Chemistries  Recent Labs  Lab 07/05/17 1444  NA 129*  K 4.1  CL 92*  CO2 24  GLUCOSE 490*  BUN 45*  CREATININE 2.05*  CALCIUM 7.9*  AST 54*  ALT 27  ALKPHOS 112  BILITOT 3.3*   ------------------------------------------------------------------------------------------------------------------  Cardiac Enzymes No results for input(s): TROPONINI in the last 168 hours. ------------------------------------------------------------------------------------------------------------------  RADIOLOGY:  Dg Chest 2 View  Result Date: 07/05/2017 CLINICAL DATA:  Two weeks of superficial thrombophlebitis with possible lower extremity cellulitis. EXAM: CHEST  2 VIEW COMPARISON:  Portable chest x-ray of July 03, 2017 FINDINGS: The lungs are adequately inflated. The interstitial markings are coarse. There is no alveolar infiltrate or pleural effusion. The heart and pulmonary vascularity are normal. The trachea is midline. The bony thorax exhibits no acute abnormality. IMPRESSION: There is no pneumonia nor other acute cardiopulmonary abnormality. Electronically Signed   By: David  Martinique M.D.    On: 07/05/2017 14:59     IMPRESSION AND PLAN:   * Right leg cellulitis and likely abscess Admit to inpatient medical bed. No history of MRSA. Start IV Zosyn.  Blood cultures pending.  Discussed with Dr. Rosana Hoes of surgery for I&D Pain medications as needed  *Sepsis present on admission.  Sepsis protocol with IV fluids.  Repeat lactic acid in 3 hours.  *Acute kidney injury over CKD stage III due to sepsis and increased torsemide dose.  Hold torsemide start IV fluids.  Repeat labs in the morning.  Monitor input and output.  *Diabetes mellitus.  Patient takes a victoza, Amaryl and metformin at home. Will give a stat dose of NovoLog 15 units and start sliding scale insulin. Check hemoglobin A1c. Uncontrolled with hyperglycemia likely due to acute infection.  *Chronic ITP with platelets at baseline of 50 K.  Monitor.  No bleeding.  *Pseudohyponatremia due to hyperglycemia.  DVT prophylaxis.  No heparin or Lovenox due to ITP.  Cannot apply SCDs due to lower extremity cellulitis.  All the records are reviewed and case discussed with ED provider. Management plans discussed with the patient, family and they are in agreement.  CODE STATUS: FULL CODE  TOTAL TIME TAKING CARE OF THIS PATIENT: 40 minutes.   Leia Alf Keldan Eplin M.D on 07/05/2017 at 4:15 PM  Between 7am to 6pm - Pager - [phone]  After 6pm go to [email or web address] - password [email or web address] [facility]  Office  [phone]  CC: Primary care physician; Rusty Aus, MD  Note: This dictation was prepared with Dragon dictation along with smaller phrase technology. Any transcriptional errors that result from this process are unintentional.

## 2017-07-05 NOTE — Consult Note (Signed)
Pharmacy Antibiotic Note  Nathan Schultz is a 62 y.o. male admitted on 07/05/2017 with sepsis secondary to right leg  cellulitis.  Pharmacy has been consulted for Zosyn dosing.  Patient was taking levofloxacin prior to admission.   Plan: Start Zosyn 3.375 IV EI every 8 hours  Height: 5' 9"  (175.3 cm) Weight: 220 lb (99.8 kg) IBW/kg (Calculated) : 70.7  Temp (24hrs), Avg:98.9 F (37.2 C), Min:98.9 F (37.2 C), Max:98.9 F (37.2 C)  Recent Labs  Lab 07/02/17 2232 07/03/17 0649 07/05/17 1444  WBC 6.1  --  15.0*  CREATININE 1.39*  --  2.05*  LATICACIDVEN  --  1.4 4.1*    Estimated Creatinine Clearance: 42.9 mL/min (A) (by C-G formula based on SCr of 2.05 mg/dL (H)).    Allergies  Allergen Reactions  . Clindamycin Other (See Comments)    Platelet allergy  . Doxycycline Other (See Comments)    Platelet allergy  . Triamterene-Hctz Other (See Comments)    Low platelets  . Vancomycin Other (See Comments)    Platelet allergy    Antimicrobials this admission: 2/5 Bactrim DS >> x 1 dose 2/5 Zosyn  >>   Dose adjustments this admission:  Microbiology results: 2/5 BCx: pending   Thank you for allowing pharmacy to be a part of this patient's care.  Pernell Dupre, PharmD, BCPS Clinical Pharmacist 07/05/2017 4:27 PM

## 2017-07-05 NOTE — ED Triage Notes (Signed)
Pt was sent from Dr. Sanjuan Dame office for admission for a possible septic infection of the RLE. States he has a known superficial clot in the RLE for the past 2 weeks and in the past several days over the weekend the pain, redness and swelling worsened, was seen in the ED Saturday evening and referred to PCP for follow up which they did today.Marland Kitchen

## 2017-07-05 NOTE — ED Provider Notes (Signed)
[facility] Emergency Department Provider Note  ____________________________________________   First MD Initiated Contact with Patient 07/05/17 1527     (approximate)  I have reviewed the triage vital signs and the nursing notes.   HISTORY  Chief Complaint Blood Infection   HPI Nathan Schultz is a 62 y.o. male who was sent to the emergency department by his primary care physician for possible bacteremia.  He has had painful swelling to his right lower extremity for the last 2 weeks.  It is been complicated by thrombophlebitis.  He has taken intramuscular ceftriaxone in clinic, cephalexin, and Levaquin without improvement in his symptoms.  He is felt subjective fever but no chills.  He now has severe throbbing aching nonradiating pain in his right lower extremity.  Worse with ambulation improved with rest.  Past Medical History:  Diagnosis Date  . Diabetes mellitus without complication ([place])   . History of ITP     Patient Active Problem List   Diagnosis Date Noted  . History of immune thrombocytopenia 05/29/2015  . Controlled type 2 diabetes mellitus without complication ([place]) 16/60/6301  . Hepatic encephalopathy ([place]) 10/02/2013  . 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 CERVICAL FUSION    . CHOLECYSTECTOMY    . TONSILLECTOMY      Prior to Admission medications   Medication Sig Start Date End Date Taking? Authorizing Provider  benzonatate (TESSALON PERLES) 100 MG capsule Take 2 capsules (200 mg total) by mouth 3 (three) times daily as needed for cough. Patient not taking: Reported on 07/03/2017 05/03/17   Sable Feil, PA-C  bifidobacterium infantis (ALIGN) capsule Take 1 capsule by mouth daily. 05/08/15   Fisher, Linden Dolin, PA-C  cephALEXin (KEFLEX) 500 MG capsule Take 1 capsule (500 mg total) by mouth 3 (three) times daily. 07/03/17   Paulette Blanch, MD  escitalopram (LEXAPRO) 5 MG tablet Take 5  mg by mouth daily.    [provider]  fexofenadine-pseudoephedrine (ALLEGRA-D) 60-120 MG 12 hr tablet Take 1 tablet by mouth 2 (two) times daily. Patient not taking: Reported on 07/03/2017 05/03/17   Sable Feil, PA-C  glimepiride (AMARYL) 2 MG tablet Take 6 mg by mouth daily with breakfast.  04/24/14   [provider]  glucose blood (ONE TOUCH ULTRA TEST) test strip USE THREE TIMES DAILY 05/06/14   [provider]  lactulose, encephalopathy, (CHRONULAC) 10 GM/15ML SOLN Take 10 g by mouth 2 (two) times daily.    [provider]  liraglutide (VICTOZA) 18 MG/3ML SOPN Inject 1.2 mg into the skin daily.    [provider]  loratadine (CLARITIN) 10 MG tablet Take 10 mg by mouth daily.    [provider]  Magnesium 400 MG TABS Take 1 tablet by mouth daily.     [provider]  metFORMIN (GLUCOPHAGE-XR) 500 MG 24 hr tablet Take 500 mg by mouth 2 (two) times daily.    [provider]  metolazone (ZAROXOLYN) 2.5 MG tablet Take 2.5 mg by mouth as directed.    [provider]  neomycin (MYCIFRADIN) 500 MG tablet Take 1,000 mg by mouth 2 (two) times daily.    [provider]  Jonetta Speak LANCETS 60F MISC AS DIRECTED 05/06/14   [provider]  oxyCODONE-acetaminophen (PERCOCET/ROXICET) 5-325 MG tablet Take 1 tablet by mouth every 4 (four) hours as needed for severe pain. 07/03/17   Paulette Blanch, MD  potassium chloride SA (  K-DUR,KLOR-CON) 20 MEQ tablet Take 40 mEq by mouth 3 (three) times daily.     [provider]  torsemide (DEMADEX) 20 MG tablet Take 30 mg by mouth daily.     [provider]    Allergies Clindamycin; Doxycycline; Triamterene-hctz; and Vancomycin  No family history on file.  Social History Social History   Tobacco Use  . Smoking status: Never Smoker  . Smokeless tobacco: Never Used  Substance Use Topics  . Alcohol use: No    Alcohol/week: 0.0 oz    Frequency:  Never  . Drug use: No    Review of Systems Constitutional: Positive for fever Eyes: No visual changes. ENT: No sore throat. Cardiovascular: Denies chest pain. Respiratory: Denies shortness of breath. Gastrointestinal: No abdominal pain.  No nausea, no vomiting.  No diarrhea.  No constipation. Genitourinary: Negative for dysuria. Musculoskeletal: Negative for back pain. Skin: Positive for rash. Neurological: Negative for headaches, focal weakness or numbness.   ____________________________________________   PHYSICAL EXAM:  VITAL SIGNS: ED Triage Vitals  Enc Vitals Group     BP 07/05/17 1436 (!) 117/57     Pulse Rate 07/05/17 1436 97     Resp 07/05/17 1436 18     Temp 07/05/17 1436 98.9 F (37.2 C)     Temp Source 07/05/17 1436 Oral     SpO2 07/05/17 1436 95 %     Weight 07/05/17 1437 220 lb (99.8 kg)     Height 07/05/17 1437 5' 9"  (1.753 m)     Head Circumference --      Peak Flow --      Pain Score 07/05/17 1437 10     Pain Loc --      Pain Edu? --      Excl. in [place]? --     Constitutional: Alert and oriented x4 appears uncomfortable wincing in pain no diaphoresis speaks in full clear sentences Eyes: PERRL EOMI. Head: Atraumatic. Nose: No congestion/rhinnorhea. Mouth/Throat: No trismus Neck: No stridor.   Cardiovascular: Normal rate, regular rhythm. Grossly normal heart sounds.  Good peripheral circulation. Respiratory: Normal respiratory effort.  No retractions. Lungs CTAB and moving good air Gastrointestinal: Soft nontender Musculoskeletal: Right lower extremity edematous with 10 cm area of deep erythema warmth and tenderness.  Central area coming to ahead appears to be slightly purulent Neurologic:  Normal speech and language. No gross focal neurologic deficits are appreciated. Skin:  Skin is warm, dry and intact. No rash noted. Psychiatric: Mood and affect are normal. Speech and behavior are normal.    ____________________________________________     DIFFERENTIAL includes but not limited to  Cellulitis, necrotizing soft tissue infection, pyomyositis, abscess, bacteremia, sepsis ____________________________________________   LABS (all labs ordered are listed, but only abnormal results are displayed)  Labs Reviewed  LACTIC ACID, PLASMA - Abnormal; Notable for the following components:      Result Value   Lactic Acid, Venous 4.1 (*)    All other components within normal limits  COMPREHENSIVE METABOLIC PANEL - Abnormal; Notable for the following components:   Sodium 129 (*)    Chloride 92 (*)    Glucose, Bld 490 (*)    BUN 45 (*)    Creatinine, Ser 2.05 (*)    Calcium 7.9 (*)    Albumin 2.9 (*)    AST 54 (*)    Total Bilirubin 3.3 (*)    GFR calc non Af Amer 33 (*)    GFR calc Af Amer 38 (*)    All  other components within normal limits  CBC WITH DIFFERENTIAL/PLATELET - Abnormal; Notable for the following components:   WBC 15.0 (*)    RBC 3.86 (*)    HCT 36.8 (*)    Platelets 82 (*)    Neutro Abs 13.6 (*)    Lymphs Abs 0.4 (*)    All other components within normal limits  CULTURE, BLOOD (SINGLE)  CULTURE, BLOOD (ROUTINE X 2)  CULTURE, BLOOD (ROUTINE X 2)  LACTIC ACID, PLASMA  URINALYSIS, COMPLETE (UACMP) WITH MICROSCOPIC  CK    Lab work reviewed by me with elevated white count and elevated lactic acid concerning for sepsis.  Acute kidney injury and dehydration __________________________________________  EKG   ____________________________________________  RADIOLOGY   ____________________________________________   PROCEDURES  Procedure(s) performed: no  .Critical Care Performed by: Darel Hong, MD Authorized by: Darel Hong, MD   Critical care provider statement:    Critical care time (minutes):  30   Critical care time was exclusive of:  Separately billable procedures and treating other patients   Critical care was necessary to treat or prevent imminent or life-threatening deterioration of  the following conditions:  Sepsis   Critical care was time spent personally by me on the following activities:  Development of treatment plan with patient or surrogate, discussions with consultants, evaluation of patient's response to treatment, examination of patient, obtaining history from patient or surrogate, ordering and performing treatments and interventions, ordering and review of laboratory studies, ordering and review of radiographic studies, pulse oximetry, re-evaluation of patient's condition and review of old charts    Critical Care performed: yes  Observation: no ____________________________________________   INITIAL IMPRESSION / ASSESSMENT AND PLAN / ED COURSE  Pertinent labs & imaging results that were available during my care of the patient were reviewed by me and considered in my medical decision making (see chart for details).  Patient arrives quite uncomfortable appearing with a tense and tender right lower extremity.  Lactic acid is 4.1 concerning for sepsis.  At this point he is failed outpatient management.  He likely has an MRSA infection and antibiotics are limited as he is allergic to vancomycin, clindamycin, and doxycycline.  In addition to Zosyn per sepsis protocol I will give him Bactrim for presumptive MRSA.  Bedside ultrasound shows no obvious fluid pocket to drain.  I discussed with the family who verbalizes understanding and agreement with the plan.  I then discussed with the hospitalist who is graciously agreed to admit the patient to his service.      ____________________________________________   FINAL CLINICAL IMPRESSION(S) / ED DIAGNOSES  Final diagnoses:  Sepsis, due to unspecified organism ([place])  Lactic acidosis  Cellulitis of right lower extremity      NEW MEDICATIONS STARTED DURING THIS VISIT:  New Prescriptions   No medications on file     Note:  This document was prepared using Dragon voice recognition software and may include  unintentional dictation errors.     Darel Hong, MD 07/05/17 [phone]

## 2017-07-05 NOTE — ED Notes (Signed)
First nurse note  Sent over from PCP with leg swelling and redness ..possible lower leg cellulitis

## 2017-07-06 DIAGNOSIS — L02415 Cutaneous abscess of right lower limb: Secondary | ICD-10-CM

## 2017-07-06 DIAGNOSIS — L03115 Cellulitis of right lower limb: Principal | ICD-10-CM

## 2017-07-06 LAB — BASIC METABOLIC PANEL
Anion gap: 9 (ref 5–15)
BUN: 49 mg/dL — AB (ref 6–20)
CO2: 27 mmol/L (ref 22–32)
CREATININE: 1.66 mg/dL — AB (ref 0.61–1.24)
Calcium: 7.4 mg/dL — ABNORMAL LOW (ref 8.9–10.3)
Chloride: 98 mmol/L — ABNORMAL LOW (ref 101–111)
GFR, EST AFRICAN AMERICAN: 49 mL/min — AB (ref >60)
GFR, EST NON AFRICAN AMERICAN: 42 mL/min — AB (ref >60)
Glucose, Bld: 164 mg/dL — ABNORMAL HIGH (ref 65–99)
Potassium: 3.2 mmol/L — ABNORMAL LOW (ref 3.5–5.1)
Sodium: 134 mmol/L — ABNORMAL LOW (ref 135–145)

## 2017-07-06 LAB — LACTIC ACID, PLASMA: LACTIC ACID, VENOUS: 2.1 mmol/L — AB (ref 0.5–1.9)

## 2017-07-06 LAB — CBC
HCT: 31.3 % — ABNORMAL LOW (ref 40.0–52.0)
Hemoglobin: 11.3 g/dL — ABNORMAL LOW (ref 13.0–18.0)
MCH: 34.1 pg — ABNORMAL HIGH (ref 26.0–34.0)
MCHC: 36.1 g/dL — ABNORMAL HIGH (ref 32.0–36.0)
MCV: 94.5 fL (ref 80.0–100.0)
PLATELETS: 71 10*3/uL — AB (ref 150–440)
RBC: 3.31 MIL/uL — AB (ref 4.40–5.90)
RDW: 14 % (ref 11.5–14.5)
WBC: 9.6 10*3/uL (ref 3.8–10.6)

## 2017-07-06 LAB — GLUCOSE, CAPILLARY
GLUCOSE-CAPILLARY: 151 mg/dL — AB (ref 65–99)
Glucose-Capillary: 221 mg/dL — ABNORMAL HIGH (ref 65–99)
Glucose-Capillary: 250 mg/dL — ABNORMAL HIGH (ref 65–99)
Glucose-Capillary: 218 mg/dL — ABNORMAL HIGH (ref 65–99)

## 2017-07-06 MED ORDER — LORATADINE 10 MG PO TABS
10.0000 mg | ORAL_TABLET | Freq: Every day | ORAL | Status: DC
  Administered 2017-07-07: 10 mg via ORAL
  Filled 2017-07-06: qty 1

## 2017-07-06 MED ORDER — LACTULOSE 10 GM/15ML PO SOLN
10.0000 g | Freq: Two times a day (BID) | ORAL | Status: DC
  Administered 2017-07-06 – 2017-07-07 (×2): 10 g via ORAL
  Filled 2017-07-06 (×2): qty 30

## 2017-07-06 MED ORDER — POTASSIUM CHLORIDE CRYS ER 20 MEQ PO TBCR
40.0000 meq | EXTENDED_RELEASE_TABLET | Freq: Once | ORAL | Status: AC
  Administered 2017-07-06: 40 meq via ORAL
  Filled 2017-07-06: qty 2

## 2017-07-06 MED ORDER — LIDOCAINE HCL 1 % IJ SOLN
10.0000 mL | Freq: Once | INTRAMUSCULAR | Status: AC
  Administered 2017-07-06: 10 mL via INTRADERMAL
  Filled 2017-07-06: qty 10

## 2017-07-06 MED ORDER — ESCITALOPRAM OXALATE 5 MG PO TABS
5.0000 mg | ORAL_TABLET | Freq: Every day | ORAL | Status: DC
  Administered 2017-07-06: 5 mg via ORAL
  Filled 2017-07-06 (×2): qty 1

## 2017-07-06 MED ORDER — OXYCODONE HCL 5 MG PO TABS
5.0000 mg | ORAL_TABLET | ORAL | Status: DC | PRN
  Administered 2017-07-06 – 2017-07-07 (×2): 10 mg via ORAL
  Filled 2017-07-06 (×2): qty 2

## 2017-07-06 MED ORDER — NEOMYCIN SULFATE 500 MG PO TABS
1000.0000 mg | ORAL_TABLET | Freq: Two times a day (BID) | ORAL | Status: DC
  Administered 2017-07-06 – 2017-07-07 (×2): 1000 mg via ORAL
  Filled 2017-07-06 (×3): qty 2

## 2017-07-06 MED ORDER — INSULIN GLARGINE 100 UNIT/ML ~~LOC~~ SOLN
7.0000 [IU] | Freq: Every day | SUBCUTANEOUS | Status: DC
  Administered 2017-07-06: 7 [IU] via SUBCUTANEOUS
  Filled 2017-07-06 (×2): qty 0.07

## 2017-07-06 NOTE — Progress Notes (Signed)
Inpatient Diabetes Program Recommendations  AACE/ADA: New Consensus Statement on Inpatient Glycemic Control (2015)  Target Ranges:  Prepandial:   less than 140 mg/dL      Peak postprandial:   less than 180 mg/dL (1-2 hours)      Critically ill patients:  140 - 180 mg/dL   Lab Results  Component Value Date   GLUCAP 221 (H) 07/06/2017   HGBA1C 8.6 (H) 07/05/2017    Review of Glycemic ControlResults for TRYSON, LUMLEY (MRN [id number]) as of 07/06/2017 15:34  Ref. Range 07/05/2017 21:11 07/06/2017 07:47 07/06/2017 11:48  Glucose-Capillary Latest Ref Range: 65 - 99 mg/dL 221 (H) 151 (H) 221 (H)    Diabetes history: Type 2 DM Outpatient Diabetes medications: Glimepiride 6 mg daily, Victoza 1.2 mg daily, Metformin 500 mg bid Current orders for Inpatient glycemic control:  Novolog moderate tid with meals and HS,  Lantus 7 units daily Inpatient Diabetes Program Recommendations:    Talked with patient and wife regarding current A1C.  It is increased from last A1C at Dr. Ammie Ferrier office.  We briefly discussed basal insulin as potential to help improve A1C.  Patient has been opposed in the past, but seemed to be considering.  I told him that it is available in insulin pen which is similar to Victoza.  We also discussed the purpose of basal insulin to help with basic metabolic needs and decrease liver production of glucose.  They state that the will discuss with Dr. Sabra Heck.  May consider increasing Lantus to 15 units daily while in the hospital.   Thanks,  Adah Perl, RN, BC-ADM Inpatient Diabetes Coordinator Pager [phone]

## 2017-07-06 NOTE — Consult Note (Signed)
SURGICAL CONSULTATION NOTE (initial) - cpt: 30160  HISTORY OF PRESENT ILLNESS (HPI):  62 y.o. male presented to [facility] ED for evaluation of Right leg pain. Patient reports he first developed Right leg redness, discomfort, and swelling ~10 - 11 days ago, at which time he presented to his PMD, who performed a venous doppler and prescribed oral levofloxacin following a single dose of ceftriaxone at PMD's office. Patient reports the pain, erythema, and swelling then worsened, antibiotics were changed to Keflex, and a repeat venous doppler ultrasound was performed. Due to patient's severe Right leg (calf) pain, he presented yesterday for further evaluation and management. Per discussion with admitting medical physician, a bedside ultrasound was performed in the ED yesterday, on which reportedly no fluid collection was appreciated. Patient otherwise reports unchanged pain and erythema, denies trauma, fever/chills, N/V, CP, or SOB. Of note, patient and his wife add that he "usually" develops similar redness, pain, and swelling of his Left leg.  Surgery is consulted by medical physician Dr. Darvin Neighbours in this context for evaluation and management of Left leg abscess.  PAST MEDICAL HISTORY (PMH):  Past Medical History:  Diagnosis Date  . Diabetes mellitus without complication ([place])   . History of ITP   . Liver cirrhosis secondary to nonalcoholic steatohepatitis (NASH) ([place])   . OSA (obstructive sleep apnea)      PAST SURGICAL HISTORY ([place]):  Past Surgical History:  Procedure Laterality Date  . CERVICAL FUSION    . CHOLECYSTECTOMY    . TONSILLECTOMY       MEDICATIONS:  Prior to Admission medications   Medication Sig Start Date End Date Taking? Authorizing Provider  bifidobacterium infantis (ALIGN) capsule Take 1 capsule by mouth daily. 05/08/15  Yes Fisher, Linden Dolin, PA-C  cephALEXin (KEFLEX) 500 MG capsule Take 1 capsule (500 mg total) by mouth 3 (three) times daily. 07/03/17  Yes Paulette Blanch, MD   escitalopram (LEXAPRO) 5 MG tablet Take 5 mg by mouth daily.   Yes [provider]  glimepiride (AMARYL) 2 MG tablet Take 6 mg by mouth daily with breakfast.  04/24/14  Yes [provider]  glucose blood (ONE TOUCH ULTRA TEST) test strip USE THREE TIMES DAILY 05/06/14  Yes [provider]  lactulose, encephalopathy, (CHRONULAC) 10 GM/15ML SOLN Take 10 g by mouth 2 (two) times daily.   Yes [provider]  liraglutide (VICTOZA) 18 MG/3ML SOPN Inject 1.2 mg into the skin daily.   Yes [provider]  loratadine (CLARITIN) 10 MG tablet Take 10 mg by mouth daily.   Yes [provider]  Magnesium 400 MG TABS Take 1 tablet by mouth daily.    Yes [provider]  metFORMIN (GLUCOPHAGE-XR) 500 MG 24 hr tablet Take 500 mg by mouth 2 (two) times daily.   Yes [provider]  metolazone (ZAROXOLYN) 2.5 MG tablet Take 2.5 mg by mouth as directed.   Yes [provider]  neomycin (MYCIFRADIN) 500 MG tablet Take 1,000 mg by mouth 2 (two) times daily.   Yes [provider]  Jonetta Speak LANCETS 10X MISC AS DIRECTED 05/06/14  Yes [provider]  oxyCODONE-acetaminophen (PERCOCET/ROXICET) 5-325 MG tablet Take 1 tablet by mouth every 4 (four) hours as needed for severe pain. 07/03/17  Yes Paulette Blanch, MD  potassium chloride SA (K-DUR,KLOR-CON) 20 MEQ tablet Take 40 mEq by mouth 3 (three) times daily.    Yes [provider]  torsemide (DEMADEX) 20 MG tablet Take 30 mg by mouth daily.  Yes [provider]  benzonatate (TESSALON PERLES) 100 MG capsule Take 2 capsules (200 mg total) by mouth 3 (three) times daily as needed for cough. Patient not taking: Reported on 07/03/2017 05/03/17   Sable Feil, PA-C  fexofenadine-pseudoephedrine (ALLEGRA-D) 60-120 MG 12 hr tablet Take 1 tablet by mouth 2 (two) times daily. Patient not taking: Reported on 07/05/2017 05/03/17   Sable Feil, PA-C     ALLERGIES:   Allergies  Allergen Reactions  . Clindamycin Other (See Comments)    Platelet allergy  . Doxycycline Other (See Comments)    Platelet allergy  . Triamterene-Hctz Other (See Comments)    Low platelets  . Vancomycin Other (See Comments)    Platelet allergy     SOCIAL HISTORY:  Social History   Socioeconomic History  . Marital status: Married    Spouse name: Not on file  . Number of children: Not on file  . Years of education: Not on file  . Highest education level: Not on file  Social Needs  . Financial resource strain: Not on file  . Food insecurity - worry: Not on file  . Food insecurity - inability: Not on file  . Transportation needs - medical: Not on file  . Transportation needs - non-medical: Not on file  Occupational History  . Not on file  Tobacco Use  . Smoking status: Never Smoker  . Smokeless tobacco: Never Used  Substance and Sexual Activity  . Alcohol use: No    Alcohol/week: 0.0 oz    Frequency: Never  . Drug use: No  . Sexual activity: Not on file  Other Topics Concern  . Not on file  Social History Narrative  . Not on file    The patient currently resides (home / rehab facility / nursing home): Home The patient normally is (ambulatory / bedbound): Ambulatory   FAMILY HISTORY:  Family History  Problem Relation Age of Onset  . CAD Father   . Diabetes Brother      REVIEW OF SYSTEMS:  Constitutional: denies weight loss, fever, chills, or sweats  Eyes: denies any other vision changes, history of eye injury  ENT: denies sore throat, hearing problems  Respiratory: denies shortness of breath, wheezing  Cardiovascular: denies chest pain, palpitations  Gastrointestinal: denies abdominal pain, N/V, or diarrhea Genitourinary: denies burning with urination or urinary frequency Musculoskeletal: denies any other joint pains or cramps  Skin: denies any other rashes or skin discolorations except as per HPI Neurological: denies any other headache,  dizziness, weakness  Psychiatric: denies any other depression, anxiety   All other review of systems were negative   VITAL SIGNS:  Temp:  [98.7 F (37.1 C)-98.9 F (37.2 C)] 98.7 F (37.1 C) (02/06 0438) Pulse Rate:  [94-100] 97 (02/06 0438) Resp:  [18-20] 20 (02/06 0438) BP: (109-121)/(57-68) 121/66 (02/06 0438) SpO2:  [95 %-96 %] 95 % (02/06 0438) Weight:  [220 lb (99.8 kg)-226 lb 13.7 oz (102.9 kg)] 226 lb 13.7 oz (102.9 kg) (02/06 0438)     Height: 5' 9"  (175.3 cm) Weight: 226 lb 13.7 oz (102.9 kg) BMI (Calculated): 33.49   INTAKE/OUTPUT:  This shift: No intake/output data recorded.  Last 2 shifts: @IOLAST2SHIFTS @   PHYSICAL EXAM:  Constitutional:  -- Normal body habitus  -- Awake, alert, and oriented x3, no apparent distress Eyes:  -- Pupils equally round and reactive to light  -- No scleral icterus, B/L no occular discharge Ear, nose, throat: -- Neck is FROM WNL -- No  jugular venous distension  Pulmonary:  -- No wheezes or rhales -- Equal breath sounds bilaterally -- Breathing non-labored at rest Cardiovascular:  -- S1, S2 present  -- No pericardial rubs  Gastrointestinal:  -- Abdomen soft, nontender, non-distended, no guarding or rebound tenderness -- No abdominal masses appreciated, pulsatile or otherwise  Musculoskeletal and Integumentary:  -- Wounds or skin discoloration: Moderately-/severely tender Left medial leg (calf) cellulitis/erythema within boundaries marked at admission with central fluctuance underlying superficial skin necrosis -- Extremities: B/L UE and LE FROM, hands and feet warm, minimal LLE <1+ edema, 2+ RLE pitting edema  Neurologic:  -- Motor function: Intact and symmetric -- Sensation: Intact and symmetric Psychiatric:  -- Mood and affect WNL  Pulse/Doppler Exam: (p=palpable; d=doppler signals; 0=none)     Right   Left   Fem  p   p   Pop  p   p   DP  p   p  Labs:  CBC Latest Ref Rng & Units 07/06/2017 07/05/2017 07/02/2017  WBC 3.8 -  10.6 K/uL 9.6 15.0(H) 6.1  Hemoglobin 13.0 - 18.0 g/dL 11.3(L) 13.1 11.9(L)  Hematocrit 40.0 - 52.0 % 31.3(L) 36.8(L) 33.1(L)  Platelets 150 - 440 K/uL 71(L) 82(L) 56(L)   CMP Latest Ref Rng & Units 07/06/2017 07/05/2017 07/02/2017  Glucose 65 - 99 mg/dL 164(H) 490(H) 485(H)  BUN 6 - 20 mg/dL 49(H) 45(H) 21(H)  Creatinine 0.61 - 1.24 mg/dL 1.66(H) 2.05(H) 1.39(H)  Sodium 135 - 145 mmol/L 134(L) 129(L) 131(L)  Potassium 3.5 - 5.1 mmol/L 3.2(L) 4.1 3.0(L)  Chloride 101 - 111 mmol/L 98(L) 92(L) 95(L)  CO2 22 - 32 mmol/L 27 24 25   Calcium 8.9 - 10.3 mg/dL 7.4(L) 7.9(L) 8.7(L)  Total Protein 6.5 - 8.1 g/dL - 7.6 -  Total Bilirubin 0.3 - 1.2 mg/dL - 3.3(H) -  Alkaline Phos 38 - 126 U/L - 112 -  AST 15 - 41 U/L - 54(H) -  ALT 17 - 63 U/L - 27 -   Imaging studies: No new pertinent imaging studies available for review, but recent lower extremity venous ultrasounds personally reviewed and discussed with patient  Assessment/Plan: (ICD-10's: L02.415) 62 y.o. male with Right medial leg (calf) abscess with surrounding cellulitis refractory to outpatient oral antibiotics therapy, complicated by pertinent comorbidities including DM, ITP with baseline platelets ~50, hepatic cirrhosis attributed to non-alcoholic steatohepatitis (NASH), and obstructive sleep apnea.   - pain control prn (including NSAIDs for RLE superficial thrombophlebitis)  - antibiotics and medical management of comorbidities per primary medical team  - all risks, benefits, and alternatives to incision and drainage of Right leg (calf) abscess were discussed with the patient and his wife, all of their questions were answered to their expressed satisfaction, patient expresses he wishes to proceed, and informed consent was accordingly obtained at that time.  - will perform bedside incision and drainage of Right leg (calf) wound  - DVT prophylaxis, ambulation encouraged  All of the above findings and recommendations were discussed with the  patient and his family, and all of patient's and his family's questions were answered to their expressed satisfaction.  Thank you for the opportunity to participate in this patient's care.   -- Marilynne Drivers Rosana Hoes, MD, [place]: [facility] - Partnering for exceptional care. Office: [phone]

## 2017-07-06 NOTE — Procedures (Addendum)
SURGICAL PROCEDURE REPORT  DATE OF PROCEDURE: 07/06/2017  ATTENDING: Corene Cornea E. Rosana Hoes, MD  ANESTHESIA: Local  PRE-OPERATIVE DIAGNOSIS: Right leg (calf) abscess (icd-10's: L02.415)  POST-OPERATIVE DIAGNOSIS: Right leg (calf) abscess (icd-10's: L02.415)  PROCEDURE(S):  1.) Incision and drainage of Right leg (calf) abscess (cpt: 10060)  INTRAOPERATIVE FINDINGS: ~20 mL of brownish and burgundy purulent fluid under significant pressure from Right leg (calf)  INTRAVENOUS FLUIDS: 0 mL crystalloid   ESTIMATED BLOOD LOSS: Minimal (<20 mL)   URINE OUTPUT: No Foley catheter   SPECIMENS: Contents of Right leg (calf) abscess for culture  IMPLANTS: None  DRAINS: None except dry gauze packing  COMPLICATIONS: None apparent  CONDITION AT END OF PROCEDURE: Hemodynamically stable and awake  DISPOSITION OF PATIENT: Remaining in patient's med-surg bed  INDICATIONS FOR PROCEDURE:  Patient is a 62 y.o. male who presented to [facility] ED for Right leg (calf) pain, erythema, and swelling x ~10-11 days, for which patient was started on oral antibiotics by patient's primary care physician without improvement. All risks, benefits, and alternatives to incision and drainage of Right leg abscess were discussed with the patient and his wife, all of patient's and his family's questions were answered to their expressed satisfaction, and informed consent was obtained and documented.  DETAILS OF PROCEDURE: Patient was appropriately identified. In supine position, procedural (abscess) site was prepped and draped in the usual sterile fashion using betadine due to overlying superficial-appearing skin necrosis, and following a brief time out, the focus of maximal fluctuance was identified, local anesthetic was injected subcutaneously, and a 2 cm incision was made using a #11 blade scalpel, upon which >20 mL of brownish and burgundy purulent fluid was immediately  released under significant pressure and deep abscess culture was obtained. Additional fluid was then expressed.  Direct pressure was applied for hemostasis, and a dry gauze was inserted and removed, after which sterile gauze was advanced into the former abscess cavity to promote drainage and prevent closure of the skin over the drained abscess cavity. Surrounding skin was then cleaned and dried, and a sterile dry gauze and adhesive dressing was applied.  I was present for all aspects of the above procedure, and there were no complications apparent.

## 2017-07-06 NOTE — Progress Notes (Signed)
Nathan Schultz at [place] NAME: Nathan Schultz    MR#:  [id number]  DATE OF BIRTH:  03-26-1956  SUBJECTIVE:  Patient with right leg pain and cellulitis  REVIEW OF SYSTEMS:    Review of Systems  Constitutional: Negative for fever, chills weight loss HENT: Negative for ear pain, nosebleeds, congestion, facial swelling, rhinorrhea, neck pain, neck stiffness and ear discharge.   Respiratory: Negative for cough, shortness of breath, wheezing  Cardiovascular: Negative for chest pain, palpitations and  positive for right leg eg swelling.  Gastrointestinal: Negative for heartburn, abdominal pain, vomiting, diarrhea or consitpation Genitourinary: Negative for dysuria, urgency, frequency, hematuria Musculoskeletal: Negative for back pain or joint pain Neurological: Negative for dizziness, seizures, syncope, focal weakness,  numbness and headaches.  Hematological: Does not bruise/bleed easily.  Psychiatric/Behavioral: Negative for hallucinations, confusion, dysphoric mood  Skin: Cellulitis of right leg  Tolerating Diet: yes      DRUG ALLERGIES:   Allergies  Allergen Reactions  . Clindamycin Other (See Comments)    Platelet allergy  . Doxycycline Other (See Comments)    Platelet allergy  . Triamterene-Hctz Other (See Comments)    Low platelets  . Vancomycin Other (See Comments)    Platelet allergy    VITALS:  Blood pressure 121/66, pulse 97, temperature 98.7 F (37.1 C), temperature source Oral, resp. rate 20, height 5' 9"  (1.753 m), weight 102.9 kg (226 lb 13.7 oz), SpO2 95 %.  PHYSICAL EXAMINATION:  Constitutional: Appears well-developed and well-nourished. No distress. HENT: Normocephalic. Marland Kitchen Oropharynx is clear and moist.  Eyes: Conjunctivae and EOM are normal. PERRLA, no scleral icterus.  Neck: Normal ROM. Neck supple. No JVD. No tracheal deviation. CVS: RRR, S1/S2 +, no murmurs, no gallops, no carotid bruit.  Pulmonary: Effort and  breath sounds normal, no stridor, rhonchi, wheezes, rales.  Abdominal: Soft. BS +,  no distension, tenderness, rebound or guarding.  Musculoskeletal: Normal range of motion.   Neuro: Alert. CN 2-12 grossly intact. No focal deficits. Skin: Right leg is edematous with large area of erythema and what appears to be a possible abscess that's coming to a head. Very pain for 2 palpation. Psychiatric: Normal mood and affect.      LABORATORY PANEL:   CBC Recent Labs  Lab 07/06/17 0437  WBC 9.6  HGB 11.3*  HCT 31.3*  PLT 71*   ------------------------------------------------------------------------------------------------------------------  Chemistries  Recent Labs  Lab 07/05/17 1444 07/06/17 0437  NA 129* 134*  K 4.1 3.2*  CL 92* 98*  CO2 24 27  GLUCOSE 490* 164*  BUN 45* 49*  CREATININE 2.05* 1.66*  CALCIUM 7.9* 7.4*  AST 54*  --   ALT 27  --   ALKPHOS 112  --   BILITOT 3.3*  --    ------------------------------------------------------------------------------------------------------------------  Cardiac Enzymes No results for input(s): TROPONINI in the last 168 hours. ------------------------------------------------------------------------------------------------------------------  RADIOLOGY:  Dg Chest 2 View  Result Date: 07/05/2017 CLINICAL DATA:  Two weeks of superficial thrombophlebitis with possible lower extremity cellulitis. EXAM: CHEST  2 VIEW COMPARISON:  Portable chest x-ray of July 03, 2017 FINDINGS: The lungs are adequately inflated. The interstitial markings are coarse. There is no alveolar infiltrate or pleural effusion. The heart and pulmonary vascularity are normal. The trachea is midline. The bony thorax exhibits no acute abnormality. IMPRESSION: There is no pneumonia nor other acute cardiopulmonary abnormality. Electronically Signed   By: Nathan  Schultz M.D.   On: 07/05/2017 14:59     ASSESSMENT AND PLAN:  62 year old male with diabetes and recent  diagnosis of thrombophlebitis in the right lower extremity who presents with pain and increasing swelling of the right leg.  1. Right leg cellulitis, extensive: Case discussed with surgical services Possible I&D today If no I&D then I would recommend CT scan to evaluate for underlying infection Elevate leg Continue Zosyn Sepsis ruled out  2. Hyponatremia: Improved with IV fluids  3. Type 2 diabetes:  Start Lantus 7 units Continue sliding scale with ADA diet  4. Hypokalemia: Replete and recheck in a.m.  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  CODE STATUS: Full  TOTAL TIME TAKING CARE OF THIS PATIENT: 30 minutes.   Discussed with nursing and Dr. Rosana Schultz   POSSIBLE D/C 2 days, DEPENDING ON CLINICAL CONDITION.   Nathan Schultz M.D on 07/06/2017 at 9:11 AM  Between 7am to 6pm - Pager - [phone] After 6pm go to [email or web address] - password [email or web address] [facility]  Office  [phone]  CC: Primary care physician; Nathan Aus, MD  Note: This dictation was prepared with Dragon dictation along with smaller phrase technology. Any transcriptional errors that result from this process are unintentional.

## 2017-07-07 ENCOUNTER — Inpatient Hospital Stay: Payer: Managed Care, Other (non HMO)

## 2017-07-07 LAB — BASIC METABOLIC PANEL
ANION GAP: 11 (ref 5–15)
BUN: 52 mg/dL — AB (ref 6–20)
CALCIUM: 8.2 mg/dL — AB (ref 8.9–10.3)
CO2: 28 mmol/L (ref 22–32)
CREATININE: 1.71 mg/dL — AB (ref 0.61–1.24)
Chloride: 96 mmol/L — ABNORMAL LOW (ref 101–111)
GFR calc Af Amer: 47 mL/min — ABNORMAL LOW (ref >60)
GFR calc non Af Amer: 41 mL/min — ABNORMAL LOW (ref >60)
GLUCOSE: 247 mg/dL — AB (ref 65–99)
Potassium: 3.5 mmol/L (ref 3.5–5.1)
Sodium: 135 mmol/L (ref 135–145)

## 2017-07-07 LAB — CBC
HCT: 33.7 % — ABNORMAL LOW (ref 40.0–52.0)
HEMOGLOBIN: 12 g/dL — AB (ref 13.0–18.0)
MCH: 33.5 pg (ref 26.0–34.0)
MCHC: 35.5 g/dL (ref 32.0–36.0)
MCV: 94.4 fL (ref 80.0–100.0)
PLATELETS: 87 10*3/uL — AB (ref 150–440)
RBC: 3.57 MIL/uL — ABNORMAL LOW (ref 4.40–5.90)
RDW: 13.8 % (ref 11.5–14.5)
WBC: 8.8 10*3/uL (ref 3.8–10.6)

## 2017-07-07 LAB — GLUCOSE, CAPILLARY
GLUCOSE-CAPILLARY: 219 mg/dL — AB (ref 65–99)
Glucose-Capillary: 201 mg/dL — ABNORMAL HIGH (ref 65–99)

## 2017-07-07 LAB — HIV ANTIBODY (ROUTINE TESTING W REFLEX): HIV Screen 4th Generation wRfx: NONREACTIVE

## 2017-07-07 MED ORDER — AMOXICILLIN-POT CLAVULANATE 875-125 MG PO TABS: 1.0000 | ORAL_TABLET | Freq: Two times a day (BID) | ORAL | 0 refills | Status: DC

## 2017-07-07 MED ORDER — OXYCODONE-ACETAMINOPHEN 7.5-325 MG PO TABS: 1.0000 | ORAL_TABLET | ORAL | 0 refills | Status: DC | PRN

## 2017-07-07 MED ORDER — INSULIN GLARGINE 100 UNIT/ML ~~LOC~~ SOLN
15.0000 [IU] | Freq: Every day | SUBCUTANEOUS | Status: DC
  Administered 2017-07-07: 15 [IU] via SUBCUTANEOUS
  Filled 2017-07-07 (×2): qty 0.15

## 2017-07-07 NOTE — Progress Notes (Signed)
IV was removed. Discharge instructions, follow-up appointments, and prescriptions were provided to the pt and wife at bedside. MD performed dressing change in front of pt and wife. All questions were answered.

## 2017-07-07 NOTE — Care Management (Signed)
Bedside RN to provide education on dressing changes prior to discharge. Patient will follow up with his PCP in 2 days for final culture results and will follow-up with surgery in one week.  No RNCM needs identified.

## 2017-07-07 NOTE — Progress Notes (Signed)
[place] Hospital Day(s): 2.   Post op day(s):  Marland Kitchen   Interval History: Patient seen and examined, no acute events or new complaints overnight. Patient reports somewhat less Right calf pain and noticeably "less pressure", particularly when ambulating. He otherwise says his RLE wound packing was changed this morning, and he denies any fever/chills, N/V, CP, or SOB.  Review of Systems:  Constitutional: denies fever, chills  HEENT: denies cough or congestion  Respiratory: denies any shortness of breath  Cardiovascular: denies chest pain or palpitations  Gastrointestinal: denies abdominal pain, N/V, or diarrhea Genitourinary: denies burning with urination or urinary frequency Musculoskeletal: denies pain, decreased motor or sensation Integumentary: denies any other rashes or skin discolorations except as per HPI Neurological: denies HA or vision/hearing changes   Vital signs in last 24 hours: [min-max] current  Temp:  [98.1 F (36.7 C)-98.3 F (36.8 C)] 98.3 F (36.8 C) (02/07 0428) Pulse Rate:  [89-94] 94 (02/07 0428) Resp:  [18-22] 22 (02/07 0428) BP: (99-123)/(61-67) 122/61 (02/07 0428) SpO2:  [95 %-98 %] 98 % (02/07 0428) Weight:  [227 lb 3.2 oz (103.1 kg)] 227 lb 3.2 oz (103.1 kg) (02/07 0500)     Height: 5' 9"  (175.3 cm) Weight: 227 lb 3.2 oz (103.1 kg) BMI (Calculated): 33.54   Intake/Output this shift:  No intake/output data recorded.   Intake/Output last 2 shifts:  @IOLAST2SHIFTS @   Physical Exam:  Constitutional: alert, cooperative and no distress  HENT: normocephalic without obvious abnormality  Eyes: PERRL, EOM's grossly intact and symmetric  Neuro: CN II - XII grossly intact and symmetric without deficit  Respiratory: breathing non-labored at rest  Cardiovascular: regular rate and sinus rhythm  Gastrointestinal: soft, non-tender, and non-distended Musculoskeletal: UE and LE FROM, no edema or wounds, motor and sensation grossly intact,  NT Integumentary: Right leg (calf) cellulitis stable to slightly improved, particular lower aspect, with no further fluctuance or purulent drainage from wound with mostly serous, somewhat serosanguinous drainage primarily attributable to edema  Pulse/Doppler Exam: (p=palpable; d=doppler signals; 0=none)    Fem  p   p   Pop  p   p   DP  p   p   Labs:  CBC Latest Ref Rng & Units 07/07/2017 07/06/2017 07/05/2017  WBC 3.8 - 10.6 K/uL 8.8 9.6 15.0(H)  Hemoglobin 13.0 - 18.0 g/dL 12.0(L) 11.3(L) 13.1  Hematocrit 40.0 - 52.0 % 33.7(L) 31.3(L) 36.8(L)  Platelets 150 - 440 K/uL 87(L) 71(L) 82(L)   CMP Latest Ref Rng & Units 07/07/2017 07/06/2017 07/05/2017  Glucose 65 - 99 mg/dL 247(H) 164(H) 490(H)  BUN 6 - 20 mg/dL 52(H) 49(H) 45(H)  Creatinine 0.61 - 1.24 mg/dL 1.71(H) 1.66(H) 2.05(H)  Sodium 135 - 145 mmol/L 135 134(L) 129(L)  Potassium 3.5 - 5.1 mmol/L 3.5 3.2(L) 4.1  Chloride 101 - 111 mmol/L 96(L) 98(L) 92(L)  CO2 22 - 32 mmol/L 28 27 24   Calcium 8.9 - 10.3 mg/dL 8.2(L) 7.4(L) 7.9(L)  Total Protein 6.5 - 8.1 g/dL - - 7.6  Total Bilirubin 0.3 - 1.2 mg/dL - - 3.3(H)  Alkaline Phos 38 - 126 U/L - - 112  AST 15 - 41 U/L - - 54(H)  ALT 17 - 63 U/L - - 27   Imaging studies: No new pertinent imaging studies   Assessment/Plan: (ICD-10's: L02.415) 62 y.o. 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              - pain control prn (including NSAIDs for RLE superficial thrombophlebitis)             - antibiotics and medical management of comorbidities per primary medical team             - continue daily dry gauze packing and dressing changes + as needed if becomes saturated sooner              - discharge planning as per primary team, outpatient surgical follow-up in 1 week             - medical management of comorbidities as per primary medical team             - DVT prophylaxis, ambulation encouraged  All of the above findings and recommendations were discussed with the patient and his wife and his medical physician and RN, and all of patient's and his family's questions were answered to their expressed satisfaction.  Thank you for the opportunity to participate in this patient's care.  -- Marilynne Drivers Rosana Hoes, MD, [place]: [facility] - Partnering for exceptional care. Office: [phone]

## 2017-07-07 NOTE — Discharge Summary (Signed)
[place] at [place] NAME: Nathan Schultz    MR#:  [id number]  DATE OF BIRTH:  1955/07/26  DATE OF ADMISSION:  07/05/2017 ADMITTING PHYSICIAN: Hillary Bow, MD  DATE OF DISCHARGE: Every 7 2019  PRIMARY CARE PHYSICIAN: Rusty Aus, MD    ADMISSION DIAGNOSIS:  Lactic acidosis [E87.2] Cellulitis of right lower extremity [L03.115] Sepsis, due to unspecified organism ([place]) [A41.9]  DISCHARGE DIAGNOSIS:  Active Problems: Right leg cellulitis   SECONDARY DIAGNOSIS:   Past Medical History:  Diagnosis Date  . Diabetes mellitus without complication ([place])   . History of ITP   . Liver cirrhosis secondary to nonalcoholic steatohepatitis (NASH) ([place])   . OSA (obstructive sleep apnea)     HOSPITAL COURSE:   62 year old male with history of diabetes and ITP who presents with right leg cellulitis.  1. Right lower leg cellulitis with abscess Abscess was I&D by Dr. Rosana Hoes. Cultures thus far GPC's Patient tolerated Zosyn and will be discharged on Augmentin Patient will follow up with his PCP in 2 days for final culture results and will follow-up with surgery in one week  2. Hyponatremia: Improved with IV fluids  3. Type 2 diabetes: Patient may resume outpatient medications with close follow-up by his primary care physician and repeat BMP as well as monitoring of blood sugars.  4. Hypokalemia: This was repleted  5. 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 Patient may resume torsemide     DISCHARGE CONDITIONS AND DIET:   Stable for discharge on diabetic diet  CONSULTS OBTAINED:  Treatment Team:  Vickie Epley, MD  DRUG ALLERGIES:   Allergies  Allergen Reactions  . Clindamycin Other (See Comments)    Platelet allergy  . Doxycycline Other (See Comments)    Platelet allergy  . Triamterene-Hctz Other (See Comments)    Low platelets  .  Vancomycin Other (See Comments)    Platelet allergy    DISCHARGE MEDICATIONS:   Allergies as of 07/07/2017      Reactions   Clindamycin Other (See Comments)   Platelet allergy   Doxycycline Other (See Comments)   Platelet allergy   Triamterene-hctz Other (See Comments)   Low platelets   Vancomycin Other (See Comments)   Platelet allergy      Medication List    STOP taking these medications   benzonatate 100 MG capsule Commonly known as:  TESSALON PERLES   cephALEXin 500 MG capsule Commonly known as:  KEFLEX   fexofenadine-pseudoephedrine 60-120 MG 12 hr tablet Commonly known as:  ALLEGRA-D   oxyCODONE-acetaminophen 5-325 MG tablet Commonly known as:  PERCOCET/ROXICET Replaced by:  oxyCODONE-acetaminophen 7.5-325 MG tablet     TAKE these medications   amoxicillin-clavulanate 875-125 MG tablet Commonly known as:  AUGMENTIN Take 1 tablet by mouth 2 (two) times daily.   bifidobacterium infantis capsule Take 1 capsule by mouth daily.   escitalopram 5 MG tablet Commonly known as:  LEXAPRO Take 5 mg by mouth daily.   glimepiride 2 MG tablet Commonly known as:  AMARYL Take 6 mg by mouth daily with breakfast.   lactulose (encephalopathy) 10 GM/15ML Soln Commonly known as:  CHRONULAC Take 10 g by mouth 2 (two) times daily.   liraglutide 18 MG/3ML Sopn Commonly known as:  VICTOZA Inject 1.2 mg into the skin daily.   loratadine 10 MG tablet Commonly known as:  CLARITIN Take 10 mg by mouth daily.  Magnesium 400 MG Tabs Take 1 tablet by mouth daily.   metFORMIN 500 MG 24 hr tablet Commonly known as:  GLUCOPHAGE-XR Take 500 mg by mouth 2 (two) times daily.   metolazone 2.5 MG tablet Commonly known as:  ZAROXOLYN Take 2.5 mg by mouth as directed.   neomycin 500 MG tablet Commonly known as:  MYCIFRADIN Take 1,000 mg by mouth 2 (two) times daily.   ONE TOUCH ULTRA TEST test strip Generic drug:  glucose blood USE THREE TIMES DAILY   ONETOUCH DELICA  LANCETS 84Z Misc AS DIRECTED   oxyCODONE-acetaminophen 7.5-325 MG tablet Commonly known as:  PERCOCET Take 1 tablet by mouth every 4 (four) hours as needed for severe pain. Replaces:  oxyCODONE-acetaminophen 5-325 MG tablet   potassium chloride SA 20 MEQ tablet Commonly known as:  K-DUR,KLOR-CON Take 40 mEq by mouth 3 (three) times daily.   torsemide 20 MG tablet Commonly known as:  DEMADEX Take 30 mg by mouth daily.            Discharge Care Instructions  (From admission, onward)        Start     Ordered   07/07/17 0000  Discharge wound care:    Comments:  Dry dressing/Curlex packing daily and as needed   07/07/17 0928        Today   CHIEF COMPLAINT:  No acute events overnight. Cellulitis is improved. Still has some edema.   VITAL SIGNS:  Blood pressure 122/61, pulse 94, temperature 98.3 F (36.8 C), temperature source Oral, resp. rate (!) 22, height 5' 9"  (1.753 m), weight 103.1 kg (227 lb 3.2 oz), SpO2 98 %.   REVIEW OF SYSTEMS:  Review of Systems  Constitutional: Negative.  Negative for chills, fever and malaise/fatigue.  HENT: Negative.  Negative for ear discharge, ear pain, hearing loss, nosebleeds and sore throat.   Eyes: Negative.  Negative for blurred vision and pain.  Respiratory: Negative.  Negative for cough, hemoptysis, shortness of breath and wheezing.   Cardiovascular: Negative.  Negative for chest pain, palpitations and leg swelling.  Gastrointestinal: Negative.  Negative for abdominal pain, blood in stool, diarrhea, nausea and vomiting.  Genitourinary: Negative.  Negative for dysuria.  Musculoskeletal: Negative.  Negative for back pain.  Skin: Negative.        Right leg cellulitis  Neurological: Negative for dizziness, tremors, speech change, focal weakness, seizures and headaches.  Endo/Heme/Allergies: Negative.  Does not bruise/bleed easily.  Psychiatric/Behavioral: Negative.  Negative for depression, hallucinations and suicidal ideas.      PHYSICAL EXAMINATION:  GENERAL:  62 y.o.-year-old patient lying in the bed with no acute distress.  NECK:  Supple, no jugular venous distention. No thyroid enlargement, no tenderness.  LUNGS: Normal breath sounds bilaterally, no wheezing, rales,rhonchi  No use of accessory muscles of respiration.  CARDIOVASCULAR: S1, S2 normal. No murmurs, rubs, or gallops.  ABDOMEN: Soft, non-tender, non-distended. Bowel sounds present. No organomegaly or mass.  EXTREMITIES: 2+ edema, no cyanosis, or clubbing.  PSYCHIATRIC: The patient is alert and oriented x 3.  SKIN: right leg cellulitis is improved. He has an incision on the shin. He has 2+ edema  DATA REVIEW:   CBC Recent Labs  Lab 07/07/17 0423  WBC 8.8  HGB 12.0*  HCT 33.7*  PLT 87*    Chemistries  Recent Labs  Lab 07/05/17 1444  07/07/17 0423  NA 129*   < > 135  K 4.1   < > 3.5  CL 92*   < >  96*  CO2 24   < > 28  GLUCOSE 490*   < > 247*  BUN 45*   < > 52*  CREATININE 2.05*   < > 1.71*  CALCIUM 7.9*   < > 8.2*  AST 54*  --   --   ALT 27  --   --   ALKPHOS 112  --   --   BILITOT 3.3*  --   --    < > = values in this interval not displayed.    Cardiac Enzymes No results for input(s): TROPONINI in the last 168 hours.  Microbiology Results  @MICRORSLT48 @  RADIOLOGY:  Dg Chest 2 View  Result Date: 07/05/2017 CLINICAL DATA:  Two weeks of superficial thrombophlebitis with possible lower extremity cellulitis. EXAM: CHEST  2 VIEW COMPARISON:  Portable chest x-ray of July 03, 2017 FINDINGS: The lungs are adequately inflated. The interstitial markings are coarse. There is no alveolar infiltrate or pleural effusion. The heart and pulmonary vascularity are normal. The trachea is midline. The bony thorax exhibits no acute abnormality. IMPRESSION: There is no pneumonia nor other acute cardiopulmonary abnormality. Electronically Signed   By: David  Martinique M.D.   On: 07/05/2017 14:59      Allergies as of 07/07/2017       Reactions   Clindamycin Other (See Comments)   Platelet allergy   Doxycycline Other (See Comments)   Platelet allergy   Triamterene-hctz Other (See Comments)   Low platelets   Vancomycin Other (See Comments)   Platelet allergy      Medication List    STOP taking these medications   benzonatate 100 MG capsule Commonly known as:  TESSALON PERLES   cephALEXin 500 MG capsule Commonly known as:  KEFLEX   fexofenadine-pseudoephedrine 60-120 MG 12 hr tablet Commonly known as:  ALLEGRA-D   oxyCODONE-acetaminophen 5-325 MG tablet Commonly known as:  PERCOCET/ROXICET Replaced by:  oxyCODONE-acetaminophen 7.5-325 MG tablet     TAKE these medications   amoxicillin-clavulanate 875-125 MG tablet Commonly known as:  AUGMENTIN Take 1 tablet by mouth 2 (two) times daily.   bifidobacterium infantis capsule Take 1 capsule by mouth daily.   escitalopram 5 MG tablet Commonly known as:  LEXAPRO Take 5 mg by mouth daily.   glimepiride 2 MG tablet Commonly known as:  AMARYL Take 6 mg by mouth daily with breakfast.   lactulose (encephalopathy) 10 GM/15ML Soln Commonly known as:  CHRONULAC Take 10 g by mouth 2 (two) times daily.   liraglutide 18 MG/3ML Sopn Commonly known as:  VICTOZA Inject 1.2 mg into the skin daily.   loratadine 10 MG tablet Commonly known as:  CLARITIN Take 10 mg by mouth daily.   Magnesium 400 MG Tabs Take 1 tablet by mouth daily.   metFORMIN 500 MG 24 hr tablet Commonly known as:  GLUCOPHAGE-XR Take 500 mg by mouth 2 (two) times daily.   metolazone 2.5 MG tablet Commonly known as:  ZAROXOLYN Take 2.5 mg by mouth as directed.   neomycin 500 MG tablet Commonly known as:  MYCIFRADIN Take 1,000 mg by mouth 2 (two) times daily.   ONE TOUCH ULTRA TEST test strip Generic drug:  glucose blood USE THREE TIMES DAILY   ONETOUCH DELICA LANCETS 60F Misc AS DIRECTED   oxyCODONE-acetaminophen 7.5-325 MG tablet Commonly known as:  PERCOCET Take 1 tablet by  mouth every 4 (four) hours as needed for severe pain. Replaces:  oxyCODONE-acetaminophen 5-325 MG tablet   potassium chloride SA 20 MEQ tablet Commonly  known as:  K-DUR,KLOR-CON Take 40 mEq by mouth 3 (three) times daily.   torsemide 20 MG tablet Commonly known as:  DEMADEX Take 30 mg by mouth daily.            Discharge Care Instructions  (From admission, onward)        Start     Ordered   07/07/17 0000  Discharge wound care:    Comments:  Dry dressing/Curlex packing daily and as needed   07/07/17 4076         Management plans discussed with the patient and he is in agreement. Stable for discharge home with [facility]  Patient should follow up with pcp   CODE STATUS:     Code Status Orders  (From admission, onward)        Start     Ordered   07/05/17 1556  Full code  Continuous     07/05/17 1556    Code Status History    Date Active Date Inactive Code Status Order ID Comments User Context   This patient has a current code status but no historical code status.      TOTAL TIME TAKING CARE OF THIS PATIENT: 38 minutes.    Note: This dictation was prepared with Dragon dictation along with smaller phrase technology. Any transcriptional errors that result from this process are unintentional.  Alphonso Gregson M.D on 07/07/2017 at 9:29 AM  Between 7am to 6pm - Pager - [phone] After 6pm go to [email or web address] - password [email or web address] [facility]  Office  [phone]  CC: Primary care physician; Rusty Aus, MD

## 2017-07-08 LAB — CULTURE, BLOOD (ROUTINE X 2)
Culture: NO GROWTH
Culture: NO GROWTH
Special Requests: ADEQUATE
Special Requests: ADEQUATE

## 2017-07-10 LAB — CULTURE, BLOOD (SINGLE)
Culture: NO GROWTH
SPECIMEN DESCRIPTION: ADEQUATE

## 2017-07-10 LAB — CULTURE, BLOOD (ROUTINE X 2)
CULTURE: NO GROWTH
Culture: NO GROWTH
Special Requests: ADEQUATE

## 2017-07-11 LAB — AEROBIC/ANAEROBIC CULTURE (SURGICAL/DEEP WOUND)

## 2017-07-11 LAB — AEROBIC/ANAEROBIC CULTURE W GRAM STAIN (SURGICAL/DEEP WOUND)

## 2017-07-12 ENCOUNTER — Other Ambulatory Visit: Payer: Self-pay

## 2017-07-14 ENCOUNTER — Ambulatory Visit: Payer: Managed Care, Other (non HMO) | Admitting: Surgery

## 2017-07-14 ENCOUNTER — Encounter: Payer: Self-pay | Admitting: Surgery

## 2017-07-14 VITALS — BP 123/70 | HR 77 | Temp 97.6°F | Ht 69.0 in | Wt 226.0 lb

## 2017-07-14 DIAGNOSIS — L02415 Cutaneous abscess of right lower limb: Secondary | ICD-10-CM

## 2017-07-14 MED ORDER — SULFAMETHOXAZOLE-TRIMETHOPRIM 800-160 MG PO TABS: 1.0000 | ORAL_TABLET | Freq: Two times a day (BID) | ORAL | 0 refills | Status: DC

## 2017-07-14 NOTE — Patient Instructions (Signed)
Please give [place] a call in case you have any questions or concerns.  Please give [place] a call in case you notice that your wound is not healing properly.  Please start taking Bactrim today and stop taking the Levaquin.

## 2017-07-14 NOTE — Progress Notes (Signed)
Surgical Clinic Progress/Follow-up Note   HPI:  62 y.o. Male presents to clinic for post-op follow-up evaluation ~1 week s/p incision and drainage of Right leg/calf wound with intense cellulitis at that time. Patient reports he was prescribed Augmentin at the time of hospital discharge and this was then changed by his PMD to levofloxacin (patient says because it's worked better for him in the past), though cultures demonstrate sensitivity to neither prescribed antibiotic. Despite non-therapeutic antibiotics, patient and his wife describe near-resolution of cellulitis and minimal further purulent drainage with improving pain, denies fever/chills, N/V, CP, or SOB.  Review of Systems:  Constitutional: denies any other weight loss, fever, chills, or sweats  Eyes: denies any other vision changes, history of eye injury  ENT: denies sore throat, hearing problems  Respiratory: denies shortness of breath, wheezing  Cardiovascular: denies chest pain, palpitations  Gastrointestinal: denies abdominal pain, N/V, or diarrhea Musculoskeletal: denies any other joint pains or cramps  Skin: Denies any other rashes or skin discolorations except as per HPI Neurological: denies any other headache, dizziness, weakness  Psychiatric: denies any other depression, anxiety  All other review of systems: otherwise negative   Vital Signs:  BP 123/70   Pulse 77   Temp 97.6 F (36.4 C) (Oral)   Ht 5' 9"  (1.753 m)   Wt 226 lb (102.5 kg)   BMI 33.37 kg/m    Physical Exam:  Constitutional:  -- Normal body habitus  -- Awake, alert, and oriented x3  Eyes:  -- Pupils equally round and reactive to light  -- No scleral icterus  Ear, nose, throat:  -- No jugular venous distension  -- No nasal drainage, bleeding Pulmonary:  -- No crackles -- Equal breath sounds bilaterally -- Breathing non-labored at rest Cardiovascular:  -- S1, S2 present  -- No pericardial rubs  Gastrointestinal:  -- Soft, nontender,  non-distended, no guarding/rebound  -- No abdominal masses appreciated, pulsatile or otherwise  Musculoskeletal / Integumentary:  -- Wounds or skin discoloration: Right leg/calf wound packed with gauze without significant purulent drainage and pink healthy granulation tissue with significantly improved/reduced surrounding erythema, mild peri-incisional tenderness to palpation  -- Extremities: B/L UE and LE FROM, hands and feet warm, no edema  Neurologic:  -- Motor function: intact and symmetric  -- Sensation: intact and symmetric   Laboratory studies:  Gram Stain (07/06/2017) ABUNDANT WBC PRESENT,BOTH PMN AND MONONUCLEAR  FEW GRAM POSITIVE COCCI     Culture (07/06/2017) MODERATE METHICILLIN RESISTANT STAPHYLOCOCCUS AUREUS  NO ANAEROBES ISOLATED      Assessment:  62 y.o. yo Male with a problem list including...  Patient Active Problem List   Diagnosis Date Noted  . Bilateral lower leg cellulitis 07/05/2017  . OSA on CPAP 01/25/2017  . History of ITP 05/29/2015  . Controlled type 2 diabetes mellitus without complication, without long-term current use of insulin ([place]) 05/12/2015  . Hepatic encephalopathy ([place]) 10/02/2013  . Non-alcoholic micronodular cirrhosis of liver ([place]) 10/02/2013    presents to clinic for follow-up evaluation, doing well s/p incision and drainage of Right calf abscess despite non-therapeutic antibiotics for cultured infection.  Plan:   - discussed culture results  - prescription for Bactrim provided to cover MRSA  - continue packing wound with dry gauze until can only fit dressing over wound  - follow-up appointment for wound check offered, but patient and his wife request as needed follow-up, will follow-up with PMD and call if any questions/concerns  - return to clinic as needed, instructed to call office  if any questions or concerns  All of the above recommendations were discussed with the patient and patient's family, and all of patient's and family's  questions were answered to their expressed satisfaction.  -- Marilynne Drivers Rosana Hoes, MD, [place]: [facility] - Partnering for exceptional care. Office: [phone]

## 2017-07-15 ENCOUNTER — Encounter: Payer: Self-pay | Admitting: Surgery

## 2017-07-18 DIAGNOSIS — L02415 Cutaneous abscess of right lower limb: Secondary | ICD-10-CM

## 2017-08-14 DIAGNOSIS — L03116 Cellulitis of left lower limb: Secondary | ICD-10-CM | POA: Insufficient documentation

## 2017-08-23 ENCOUNTER — Ambulatory Visit
Admission: RE | Admit: 2017-08-23 | Discharge: 2017-08-23 | Disposition: A | Discharge status: Home / Self Care | Payer: Managed Care, Other (non HMO) | Source: Ambulatory Visit | Attending: Family Medicine | Admitting: Family Medicine

## 2017-08-23 ENCOUNTER — Encounter: Payer: Self-pay | Admitting: Family Medicine

## 2017-08-23 ENCOUNTER — Ambulatory Visit: Payer: Self-pay | Admitting: Family Medicine

## 2017-08-23 VITALS — BP 138/73 | HR 83 | Temp 98.9°F | Resp 18

## 2017-08-23 DIAGNOSIS — R0602 Shortness of breath: Secondary | ICD-10-CM | POA: Insufficient documentation

## 2017-08-23 DIAGNOSIS — R062 Wheezing: Secondary | ICD-10-CM | POA: Diagnosis not present

## 2017-08-23 DIAGNOSIS — R0781 Pleurodynia: Secondary | ICD-10-CM | POA: Diagnosis present

## 2017-08-23 DIAGNOSIS — R05 Cough: Secondary | ICD-10-CM | POA: Insufficient documentation

## 2017-08-23 DIAGNOSIS — R5383 Other fatigue: Secondary | ICD-10-CM | POA: Diagnosis not present

## 2017-08-23 DIAGNOSIS — J209 Acute bronchitis, unspecified: Secondary | ICD-10-CM

## 2017-08-23 DIAGNOSIS — R509 Fever, unspecified: Secondary | ICD-10-CM

## 2017-08-23 LAB — POCT INFLUENZA A/B
INFLUENZA A, POC: NEGATIVE
INFLUENZA B, POC: NEGATIVE

## 2017-08-23 MED ORDER — AZITHROMYCIN 250 MG PO TABS: ORAL_TABLET | ORAL | 0 refills | Status: DC

## 2017-08-23 MED ORDER — PREDNISONE 20 MG PO TABS: 10.0000 mg | ORAL_TABLET | Freq: Two times a day (BID) | ORAL | 0 refills | Status: AC

## 2017-08-23 MED ORDER — ALBUTEROL SULFATE HFA 108 (90 BASE) MCG/ACT IN AERS: 2.0000 | INHALATION_SPRAY | RESPIRATORY_TRACT | 0 refills | Status: AC | PRN

## 2017-08-23 NOTE — Progress Notes (Signed)
Subjective: Cough     Nathan Schultz is a 62 y.o. male who presents for evaluation of productive cough with yellow/green sputum, nasal congestion with clear nasal discharge, fatigue, body aches, sore throat, wheezing, shortness of breath, "right rib cage pain", nausea, and low-grade fever.  T-max between 100.0 and 101.0, noted last night only.  Afebrile today.  Symptoms began 7 days ago with the nasal discharge and cough and then has had a gradual onset of remaining symptoms.  Patient feels like the cough worsened yesterday.  Patient reports his wife had the same symptoms and is beginning to feel better.  Patient reports being unable to sleep last night due to the cough, worsening his fatigue.  Patient reports a history of allergic rhinitis and that his nasal symptoms feel like allergies.  Patient was recently hospitalized with right leg cellulitis and then subsequently left leg cellulitis.  Patient is currently taking Bactrim for this.  Throughout and following his hospital stay he was treated with multiple antibiotics, including Augmentin, Levaquin, Rocephin, cefepime, daptomycin, and then finally Bactrim.  Patient reports resolution of the symptoms in his right leg and significant improvement in cellulitis of his left leg, since symptoms began last week.  Patient was hospitalized last Sunday, Monday, and Tuesday in [place] for this, according the patient.  Patient does not believe that his low-grade fever yesterday is related to cellulitis, as his cellulitis has been improving.  This is being followed by his primary care provider Dr. Sabra Heck.  Patient is wearing compression stockings. Treatment to date: Mucinex and Claritin.  Denies rash, vomiting, diarrhea, back pain, ear pain, difficulty swallowing, confusion, or headache/facial pressure. History of smoking, asthma, COPD: Negative History of recurrent sinus and/or lung infections: Denies. Medical history: Diabetes (insulin-dependent, well controlled  according to the patient), OSA, hepatic encephalopathy, cellulitis as described above, ITP. Antibiotic use in the last month: Positive, see above.   Review of Systems Pertinent items noted in HPI and remainder of comprehensive ROS otherwise negative.     Objective:   Physical Exam General: Awake, alert, and oriented. No acute distress. Well developed, hydrated and nourished. Appears stated age. Nontoxic appearance.  HEENT:  PND noted.  Mild erythema to posterior oropharynx.  No edema or exudates of pharynx or tonsils. No erythema or bulging of TM.  Mild erythema/edema to nasal mucosa. Sinuses nontender. Supple neck without adenopathy.  Moist mucous membranes. Cardiac: Heart rate and rhythm are normal. No murmurs, gallops, or rubs are auscultated. S1 and S2 are heard and are of normal intensity.  Respiratory: No signs of respiratory distress. Generalized, faint expiratory wheezing.  Lungs otherwise clear. No tachypnea. Able to speak in full sentences without dyspnea. Nonlabored respirations.  Skin: Skin is warm, dry and intact. Appropriate color for ethnicity. No cyanosis noted.  Very slight erythema and edema to anterior left lower leg, patient reports is significantly improved.  No drainage, redness, break in the skin, or warmth to the touch.  No  TTP.  Diagnostic Results: Flu swab negative.  Chest x-ray negative.  Assessment:    bronchitis   Plan:    Suggested symptomatic OTC remedies.   Increase fluid intake and rest.  Provided patient with a note to take off work the next few days. Prescribed albuterol inhaler to use as needed for wheezing/cough/shortness of breath.  Patient educated regarding his medication. Advised patient to closely monitor his blood sugar. Sent a message to Dr. Sabra Heck, patient's PCP, to discuss the best care for this patient.  After talking  to Dr. Sabra Heck will call patient to further discuss plan of care. Follow-up with Dr. Sabra Heck this week. Discussed red flag  symptoms and circumstances with which to seek medical care.   New Prescriptions   ALBUTEROL (PROVENTIL HFA;VENTOLIN HFA) 108 (90 BASE) MCG/ACT INHALER    Inhale 2 puffs into the lungs every 4 (four) hours as needed for wheezing or shortness of breath (wheezing, cough, shortness of breath).

## 2017-08-23 NOTE — Progress Notes (Addendum)
Called and spoke with the patient's PCP Dr. Sabra Heck and updated him regarding my visit with the patient this morning. Dr. Sabra Heck advised me to prescribe Biaxin if the patient didn't have any contraindications but the patient is on multiple medications and out of concern for interactions I spoke with the pharmacist at the patient's pharmacy regarding a safer alternative that would be equally efficacious and he recommended a five day course of azithromycin. Informed pharmacist of the patient's medications, recent antibiotics use, and clinical condition. The pharmacist said that he would flag the prescription order so that he could provide in-depth education to the patient regarding these medications when he comes to pick them up from the pharmacy. I spoke with the patient and his wife regarding this and the patient reports that he has taken this previously and tolerated it well without any issues. Educated the patient regarding side/adverse effects of this medication and indications to seek medical care, including risk of QTc syndrome. Dr. Sabra Heck also recommended that I prescribe 10 mg of prednisone BID x 5 days, which I also discussed with the pharmacist and patient. Informed patient that he needed to follow up with Dr. Sabra Heck in the next few days and he scheduled an appointment with Dr. Sabra Heck for this Thursday morning. Informed Dr. Sabra Heck of the final care plan. Red flag symptoms and indications to seek medical care discussed with the patient.

## 2017-08-23 NOTE — Addendum Note (Signed)
Addended by: Tyrone Nine on: 08/23/2017 05:03 PM   Modules accepted: Orders

## 2018-02-01 ENCOUNTER — Ambulatory Visit
Admission: RE | Admit: 2018-02-01 | Discharge: 2018-02-01 | Disposition: A | Discharge status: Home / Self Care | Payer: Managed Care, Other (non HMO) | Source: Ambulatory Visit | Attending: Physician Assistant | Admitting: Physician Assistant

## 2018-02-01 ENCOUNTER — Other Ambulatory Visit: Payer: Self-pay | Admitting: Physician Assistant

## 2018-02-01 DIAGNOSIS — M79604 Pain in right leg: Secondary | ICD-10-CM

## 2018-02-01 DIAGNOSIS — R609 Edema, unspecified: Secondary | ICD-10-CM

## 2018-12-04 ENCOUNTER — Other Ambulatory Visit: Payer: Self-pay | Admitting: Internal Medicine

## 2018-12-04 DIAGNOSIS — I6300 Cerebral infarction due to thrombosis of unspecified precerebral artery: Secondary | ICD-10-CM

## 2018-12-08 ENCOUNTER — Encounter (HOSPITAL_COMMUNITY): Payer: Self-pay

## 2018-12-08 ENCOUNTER — Ambulatory Visit (HOSPITAL_COMMUNITY): Admission: RE | Admit: 2018-12-08 | Payer: Managed Care, Other (non HMO) | Source: Ambulatory Visit

## 2019-03-19 ENCOUNTER — Other Ambulatory Visit: Payer: Self-pay | Admitting: Orthopedic Surgery

## 2019-03-26 ENCOUNTER — Other Ambulatory Visit: Payer: Self-pay

## 2019-03-26 ENCOUNTER — Encounter
Admission: RE | Admit: 2019-03-26 | Discharge: 2019-03-26 | Disposition: A | Discharge status: Home / Self Care | Payer: Managed Care, Other (non HMO) | Source: Ambulatory Visit | Attending: Orthopedic Surgery | Admitting: Orthopedic Surgery

## 2019-03-26 DIAGNOSIS — E118 Type 2 diabetes mellitus with unspecified complications: Secondary | ICD-10-CM | POA: Insufficient documentation

## 2019-03-26 DIAGNOSIS — Z01818 Encounter for other preprocedural examination: Secondary | ICD-10-CM | POA: Insufficient documentation

## 2019-03-26 HISTORY — DX: Other seasonal allergic rhinitis: J30.2

## 2019-03-26 HISTORY — DX: Personal history of Methicillin resistant Staphylococcus aureus infection: Z86.14

## 2019-03-26 HISTORY — DX: Anemia, unspecified: D64.9

## 2019-03-26 HISTORY — DX: Cellulitis, unspecified: L03.90

## 2019-03-26 HISTORY — DX: Unspecified osteoarthritis, unspecified site: M19.90

## 2019-03-26 LAB — URINALYSIS, ROUTINE W REFLEX MICROSCOPIC
Bilirubin Urine: NEGATIVE
Glucose, UA: NEGATIVE mg/dL
Hgb urine dipstick: NEGATIVE
Ketones, ur: NEGATIVE mg/dL
Nitrite: NEGATIVE
Protein, ur: NEGATIVE mg/dL
Specific Gravity, Urine: 1.014 (ref 1.005–1.030)
Squamous Epithelial / HPF: NONE SEEN (ref 0–5)
WBC, UA: >50 WBC/hpf — ABNORMAL HIGH (ref 0–5)
pH: 5 (ref 5.0–8.0)

## 2019-03-26 LAB — BASIC METABOLIC PANEL
Anion gap: 9 (ref 5–15)
BUN: 28 mg/dL — ABNORMAL HIGH (ref 8–23)
CO2: 20 mmol/L — ABNORMAL LOW (ref 22–32)
Calcium: 8.3 mg/dL — ABNORMAL LOW (ref 8.9–10.3)
Chloride: 109 mmol/L (ref 98–111)
Creatinine, Ser: 1.19 mg/dL (ref 0.61–1.24)
GFR calc Af Amer: >60 mL/min (ref >60)
GFR calc non Af Amer: >60 mL/min (ref >60)
Glucose, Bld: 155 mg/dL — ABNORMAL HIGH (ref 70–99)
Potassium: 4.4 mmol/L (ref 3.5–5.1)
Sodium: 138 mmol/L (ref 135–145)

## 2019-03-26 LAB — CBC
HCT: 31 % — ABNORMAL LOW (ref 39.0–52.0)
Hemoglobin: 10.9 g/dL — ABNORMAL LOW (ref 13.0–17.0)
MCH: 33.3 pg (ref 26.0–34.0)
MCHC: 35.2 g/dL (ref 30.0–36.0)
MCV: 94.8 fL (ref 80.0–100.0)
Platelets: 49 10*3/uL — ABNORMAL LOW (ref 150–400)
RBC: 3.27 MIL/uL — ABNORMAL LOW (ref 4.22–5.81)
RDW: 13.6 % (ref 11.5–15.5)
WBC: 5 10*3/uL (ref 4.0–10.5)
nRBC: 0 % (ref 0.0–0.2)

## 2019-03-26 LAB — TYPE AND SCREEN
ABO/RH(D): O POS
Antibody Screen: NEGATIVE

## 2019-03-26 LAB — PROTIME-INR
INR: 1.4 — ABNORMAL HIGH (ref 0.8–1.2)
Prothrombin Time: 17.4 seconds — ABNORMAL HIGH (ref 11.4–15.2)

## 2019-03-26 LAB — SEDIMENTATION RATE: Sed Rate: 61 mm/hr — ABNORMAL HIGH (ref 0–20)

## 2019-03-26 LAB — SURGICAL PCR SCREEN
MRSA, PCR: POSITIVE — AB
Staphylococcus aureus: POSITIVE — AB

## 2019-03-26 LAB — APTT: aPTT: 43 seconds — ABNORMAL HIGH (ref 24–36)

## 2019-03-26 NOTE — Patient Instructions (Signed)
Your procedure is scheduled [facility] 11/3 Report to Day Surgery. To find out your arrival time please call [phone] between 1PM - 3PM on Mon 10/31.  Remember: Instructions that are not followed completely may result in serious medical risk,  up to and including death, or upon the discretion of your surgeon and anesthesiologist your  surgery may need to be rescheduled.     _X__ 1. Do not eat food after midnight the night before your procedure.                 No gum chewing or hard candies. You may drink clear liquids up to 2 hours                 before you are scheduled to arrive for your surgery- DO not drink clear                 liquids within 2 hours of the start of your surgery.                 Clear Liquids include:  water, Gatorade, Black Coffee or Tea (Do not add                 anything to coffee or tea).  __X__2.  On the morning of surgery brush your teeth with toothpaste and water, you                may rinse your mouth with mouthwash if you wish.  Do not swallow any toothpaste of mouthwash.     _X__ 3.  No Alcohol for 24 hours before or after surgery.   ___ 4.  Do Not Smoke or use e-cigarettes For 24 Hours Prior to Your Surgery.                 Do not use any chewable tobacco products for at least 6 hours prior to                 surgery.  ____  5.  Bring all medications with you on the day of surgery if instructed.   __x__  6.  Notify your doctor if there is any change in your medical condition      (cold, fever, infections).     Do not wear jewelry, make-up, hairpins, clips or nail polish. Do not wear lotions, powders, or perfumes. You may wear deodorant. Do not shave 48 hours prior to surgery. Men may shave face and neck. Do not bring valuables to the hospital.    [facility] is not responsible for any belongings or valuables.  Contacts, dentures or bridgework may not be worn into surgery. Leave your suitcase in the car. After surgery  it may be brought to your room. For patients admitted to the hospital, discharge time is determined by your treatment team.   Patients discharged the day of surgery will not be allowed to drive home.   Please read over the following fact sheets that you were given:   MRSA __x__ Take these medicines the morning of surgery with A SIP OF WATER:    1. loratadine (CLARITIN) 10 MG tablet  2.   3.   4.  5.  6.  ____ Fleet Enema (as directed)   __x__ Use CHG Soap as directed  __x__ Use inhalers on the day of surgery albuterol (PROVENTIL HFA;VENTOLIN HFA) 108 (90 Base) MCG/ACT inhaler  ____ Stop metformin 2 days prior to surgery    __x__ Take  1/2 of usual insulin dose the night before surgery. No insulin the morning          of surgery.   ____ Stop Coumadin/Plavix/aspirin on   __x__ Stop Anti-inflammatories ibuprofen aleve or aspirin   For 7 days   May take tylenol   __x__ Stop supplements until after surgery.  TURMERIC PO  ____ Bring C-Pap to the hospital.

## 2019-03-28 LAB — URINE CULTURE: Culture: 40000 — AB

## 2019-03-30 ENCOUNTER — Other Ambulatory Visit: Payer: Self-pay | Admitting: Orthopedic Surgery

## 2019-03-30 ENCOUNTER — Other Ambulatory Visit
Admission: RE | Admit: 2019-03-30 | Discharge: 2019-03-30 | Disposition: A | Discharge status: Home / Self Care | Payer: Managed Care, Other (non HMO) | Source: Ambulatory Visit | Attending: Orthopedic Surgery | Admitting: Orthopedic Surgery

## 2019-03-30 ENCOUNTER — Other Ambulatory Visit: Payer: Self-pay

## 2019-03-30 DIAGNOSIS — Z20828 Contact with and (suspected) exposure to other viral communicable diseases: Secondary | ICD-10-CM | POA: Insufficient documentation

## 2019-03-30 DIAGNOSIS — Z01812 Encounter for preprocedural laboratory examination: Secondary | ICD-10-CM | POA: Diagnosis present

## 2019-03-30 LAB — SARS CORONAVIRUS 2 (TAT 6-24 HRS): SARS Coronavirus 2: NEGATIVE

## 2019-04-03 ENCOUNTER — Encounter: Payer: Self-pay | Admitting: Anesthesiology

## 2019-04-03 ENCOUNTER — Encounter: Admission: RE | Payer: Self-pay | Source: Home / Self Care

## 2019-04-03 ENCOUNTER — Inpatient Hospital Stay
Admission: RE | Admit: 2019-04-03 | Payer: Managed Care, Other (non HMO) | Source: Home / Self Care | Admitting: Orthopedic Surgery

## 2019-04-03 SURGERY — ARTHROPLASTY, HIP, TOTAL, ANTERIOR APPROACH
Anesthesia: Choice | Site: Hip | Laterality: Left

## 2019-04-03 MED ORDER — VANCOMYCIN HCL IN DEXTROSE 1-5 GM/200ML-% IV SOLN: 1000.0000 mg | Freq: Once | INTRAVENOUS | Status: DC

## 2019-04-03 MED ORDER — CEFAZOLIN SODIUM-DEXTROSE 2-4 GM/100ML-% IV SOLN: 2.0000 g | INTRAVENOUS | Status: DC

## 2019-04-05 ENCOUNTER — Encounter: Payer: Self-pay | Admitting: Oncology

## 2019-04-05 ENCOUNTER — Other Ambulatory Visit: Payer: Self-pay

## 2019-04-05 NOTE — Progress Notes (Signed)
Patient pre screened for office appointment, no questions or concerns today. Please clarify his medication metaxalone which patient states has been added. The dose he gave me is not correct he stated he was taking 2.65m TID.

## 2019-04-06 ENCOUNTER — Inpatient Hospital Stay: Payer: Managed Care, Other (non HMO) | Attending: Oncology | Admitting: Oncology

## 2019-04-06 ENCOUNTER — Other Ambulatory Visit: Payer: Self-pay

## 2019-04-06 ENCOUNTER — Inpatient Hospital Stay: Payer: Managed Care, Other (non HMO)

## 2019-04-06 VITALS — BP 118/66 | HR 81 | Temp 98.2°F | Resp 18 | Wt 245.1 lb

## 2019-04-06 DIAGNOSIS — G4733 Obstructive sleep apnea (adult) (pediatric): Secondary | ICD-10-CM | POA: Diagnosis not present

## 2019-04-06 DIAGNOSIS — N1831 Chronic kidney disease, stage 3a: Secondary | ICD-10-CM | POA: Diagnosis not present

## 2019-04-06 DIAGNOSIS — D693 Immune thrombocytopenic purpura: Secondary | ICD-10-CM | POA: Diagnosis not present

## 2019-04-06 DIAGNOSIS — E1122 Type 2 diabetes mellitus with diabetic chronic kidney disease: Secondary | ICD-10-CM | POA: Diagnosis not present

## 2019-04-06 DIAGNOSIS — K7460 Unspecified cirrhosis of liver: Secondary | ICD-10-CM | POA: Diagnosis not present

## 2019-04-06 DIAGNOSIS — D649 Anemia, unspecified: Secondary | ICD-10-CM

## 2019-04-06 DIAGNOSIS — Z79899 Other long term (current) drug therapy: Secondary | ICD-10-CM | POA: Insufficient documentation

## 2019-04-06 DIAGNOSIS — Z794 Long term (current) use of insulin: Secondary | ICD-10-CM | POA: Insufficient documentation

## 2019-04-06 DIAGNOSIS — D696 Thrombocytopenia, unspecified: Secondary | ICD-10-CM | POA: Diagnosis not present

## 2019-04-06 DIAGNOSIS — D6959 Other secondary thrombocytopenia: Secondary | ICD-10-CM | POA: Diagnosis not present

## 2019-04-06 DIAGNOSIS — Z862 Personal history of diseases of the blood and blood-forming organs and certain disorders involving the immune mechanism: Secondary | ICD-10-CM

## 2019-04-06 DIAGNOSIS — M1990 Unspecified osteoarthritis, unspecified site: Secondary | ICD-10-CM | POA: Diagnosis not present

## 2019-04-06 DIAGNOSIS — D631 Anemia in chronic kidney disease: Secondary | ICD-10-CM | POA: Insufficient documentation

## 2019-04-06 DIAGNOSIS — R161 Splenomegaly, not elsewhere classified: Secondary | ICD-10-CM | POA: Diagnosis not present

## 2019-04-06 LAB — CBC WITH DIFFERENTIAL/PLATELET
Abs Immature Granulocytes: 0.01 10*3/uL (ref 0.00–0.07)
Basophils Absolute: 0.1 10*3/uL (ref 0.0–0.1)
Basophils Relative: 1 %
Eosinophils Absolute: 0.3 10*3/uL (ref 0.0–0.5)
Eosinophils Relative: 6 %
HCT: 31 % — ABNORMAL LOW (ref 39.0–52.0)
Hemoglobin: 11.5 g/dL — ABNORMAL LOW (ref 13.0–17.0)
Immature Granulocytes: 0 %
Lymphocytes Relative: 20 %
Lymphs Abs: 1.2 10*3/uL (ref 0.7–4.0)
MCH: 34.5 pg — ABNORMAL HIGH (ref 26.0–34.0)
MCHC: 37.1 g/dL — ABNORMAL HIGH (ref 30.0–36.0)
MCV: 93.1 fL (ref 80.0–100.0)
Monocytes Absolute: 0.6 10*3/uL (ref 0.1–1.0)
Monocytes Relative: 11 %
Neutro Abs: 3.5 10*3/uL (ref 1.7–7.7)
Neutrophils Relative %: 62 %
Platelets: 55 10*3/uL — ABNORMAL LOW (ref 150–400)
RBC: 3.33 MIL/uL — ABNORMAL LOW (ref 4.22–5.81)
RDW: 13.8 % (ref 11.5–15.5)
WBC: 5.7 10*3/uL (ref 4.0–10.5)
nRBC: 0 % (ref 0.0–0.2)

## 2019-04-06 LAB — COMPREHENSIVE METABOLIC PANEL
ALT: 28 U/L (ref 0–44)
AST: 40 U/L (ref 15–41)
Albumin: 3.2 g/dL — ABNORMAL LOW (ref 3.5–5.0)
Alkaline Phosphatase: 134 U/L — ABNORMAL HIGH (ref 38–126)
Anion gap: 6 (ref 5–15)
BUN: 28 mg/dL — ABNORMAL HIGH (ref 8–23)
CO2: 24 mmol/L (ref 22–32)
Calcium: 8.4 mg/dL — ABNORMAL LOW (ref 8.9–10.3)
Chloride: 102 mmol/L (ref 98–111)
Creatinine, Ser: 1.46 mg/dL — ABNORMAL HIGH (ref 0.61–1.24)
GFR calc Af Amer: 58 mL/min — ABNORMAL LOW (ref >60)
GFR calc non Af Amer: 50 mL/min — ABNORMAL LOW (ref >60)
Glucose, Bld: 197 mg/dL — ABNORMAL HIGH (ref 70–99)
Potassium: 4.3 mmol/L (ref 3.5–5.1)
Sodium: 132 mmol/L — ABNORMAL LOW (ref 135–145)
Total Bilirubin: 3 mg/dL — ABNORMAL HIGH (ref 0.3–1.2)
Total Protein: 7.8 g/dL (ref 6.5–8.1)

## 2019-04-06 LAB — HEPATITIS PANEL, ACUTE
HCV Ab: NONREACTIVE
Hep A IgM: NONREACTIVE
Hep B C IgM: NONREACTIVE
Hepatitis B Surface Ag: NONREACTIVE

## 2019-04-06 LAB — APTT: aPTT: 41 seconds — ABNORMAL HIGH (ref 24–36)

## 2019-04-06 LAB — PROTIME-INR
INR: 1.4 — ABNORMAL HIGH (ref 0.8–1.2)
Prothrombin Time: 17.4 seconds — ABNORMAL HIGH (ref 11.4–15.2)

## 2019-04-06 LAB — LACTATE DEHYDROGENASE: LDH: 189 U/L (ref 98–192)

## 2019-04-06 LAB — IMMATURE PLATELET FRACTION: Immature Platelet Fraction: 2.7 % (ref 1.2–8.6)

## 2019-04-06 LAB — RETIC PANEL
Immature Retic Fract: 5.9 % (ref 2.3–15.9)
RBC.: 3.33 MIL/uL — ABNORMAL LOW (ref 4.22–5.81)
Retic Count, Absolute: 130.2 10*3/uL (ref 19.0–186.0)
Retic Ct Pct: 3.9 % — ABNORMAL HIGH (ref 0.4–3.1)
Reticulocyte Hemoglobin: 39 pg (ref >27.9)

## 2019-04-06 LAB — FOLATE: Folate: 24 ng/mL (ref >5.9)

## 2019-04-06 LAB — VITAMIN B12: Vitamin B-12: 1344 pg/mL — ABNORMAL HIGH (ref 180–914)

## 2019-04-06 LAB — HIV ANTIBODY (ROUTINE TESTING W REFLEX): HIV Screen 4th Generation wRfx: NONREACTIVE

## 2019-04-06 NOTE — Progress Notes (Signed)
Patient was scheduled for left total hip arthroplasty but had to be cancelled due to low platelet results.  He has been seen at the [facility] in 2011.

## 2019-04-06 NOTE — Progress Notes (Signed)
Hematology/Oncology Consult note [facility] [phone] [phone]   Patient Care Team: Rusty Aus, MD as PCP - General (Internal Medicine)  REFERRING PROVIDER: Hessie Knows, MD  CHIEF COMPLAINTS/REASON FOR VISIT:  Evaluation of thrombocytopenia  HISTORY OF PRESENTING ILLNESS:  Nathan Schultz is a 63 y.o. male who was seen in consultation at the request of Hessie Knows, MD for evaluation of thrombocytopenia . Patient was scheduled for left total hip arthroplasty but had to be canceled due to low platelet counts  Reviewed patient's labs done previously.  03/26/2019 labs showed decreased platelet counts at 49,000. wbc 5 hemoglobin 10.9 Patient was accompanied by wife.   Per patient and wife, patient has a history of drug induced immune thrombocytopenia and was seen at the cancer center in 2000 02/2010. Wife brought previous records. Extensive medical records reviewed were performed by me. Patient developed cellulitis/bacteremia and was treated with antibiotics with vancomycin and clindamycin. At that time patient developed thrombocytopenia, with platelet counts of 8000. Patient was treated with IVIG, slow steroid tapering course and his platelet counts responded to the treatments.  Recovered to 100s. Patient has had a bone marrow biopsy done as well which reviewed and no malignancy.  Patient also had a history of cirrhosis.  Previously had liver biopsy which showed chronic hepatitis. Cirrhosis-NASH.    Patient has not followed up with gastroenterology for years. He has a history of splenomegaly, detected on previous images.  Patient reports feeling well at baseline.  Easy bruising.  Denies any night sweats, fever, chills, unintentional weight loss. He has left hip pain  Review of Systems  Constitutional: Positive for fatigue. Negative for appetite change, chills, fever and unexpected weight change.  HENT:   Negative for hearing loss  and voice change.   Eyes: Negative for eye problems and icterus.  Respiratory: Negative for chest tightness, cough and shortness of breath.   Cardiovascular: Negative for chest pain and leg swelling.  Gastrointestinal: Negative for abdominal distention and abdominal pain.  Endocrine: Negative for hot flashes.  Genitourinary: Negative for difficulty urinating, dysuria and frequency.   Musculoskeletal: Positive for arthralgias.  Skin: Negative for itching and rash.  Neurological: Negative for light-headedness and numbness.  Hematological: Negative for adenopathy. Bruises/bleeds easily.  Psychiatric/Behavioral: Negative for confusion.    MEDICAL HISTORY:  Past Medical History:  Diagnosis Date  . Anemia   . Arthritis   . Cellulitis    legs feet and hand  . Diabetes mellitus without complication ([place])   . History of ITP   . History of methicillin resistant staphylococcus aureus (MRSA)   . Liver cirrhosis secondary to nonalcoholic steatohepatitis (NASH) ([place])   . OSA (obstructive sleep apnea)    no CPAP couldn't tolerate  . Seasonal allergies     SURGICAL HISTORY: Past Surgical History:  Procedure Laterality Date  . CERVICAL FUSION    . CHOLECYSTECTOMY    . ROTATOR CUFF REPAIR Right   . TONSILLECTOMY      SOCIAL HISTORY: Social History   Socioeconomic History  . Marital status: Married    Spouse name: Not on file  . Number of children: Not on file  . Years of education: Not on file  . Highest education level: Not on file  Occupational History  . Not on file  Social Needs  . Financial resource strain: Not on file  . Food insecurity    Worry: Not on file    Inability: Not on file  . Transportation needs  Medical: Not on file    Non-medical: Not on file  Tobacco Use  . Smoking status: Never Smoker  . Smokeless tobacco: Never Used  Substance and Sexual Activity  . Alcohol use: No    Alcohol/week: 0.0 standard drinks    Frequency: Never  . Drug use: No  .  Sexual activity: Not on file  Lifestyle  . Physical activity    Days per week: Not on file    Minutes per session: Not on file  . Stress: Not on file  Relationships  . Social Herbalist on phone: Not on file    Gets together: Not on file    Attends religious service: Not on file    Active member of club or organization: Not on file    Attends meetings of clubs or organizations: Not on file    Relationship status: Not on file  . Intimate partner violence    Fear of current or ex partner: Not on file    Emotionally abused: Not on file    Physically abused: Not on file    Forced sexual activity: Not on file  Other Topics Concern  . Not on file  Social History Narrative  . Not on file    FAMILY HISTORY: Family History  Problem Relation Age of Onset  . CAD Father   . Diabetes Brother     ALLERGIES:  is allergic to clindamycin; doxycycline; triamterene-hctz; and vancomycin.  MEDICATIONS:  Current Outpatient Medications  Medication Sig Dispense Refill  . albuterol (PROVENTIL HFA;VENTOLIN HFA) 108 (90 Base) MCG/ACT inhaler Inhale 2 puffs into the lungs every 4 (four) hours as needed for wheezing or shortness of breath (wheezing, cough, shortness of breath). 1 Inhaler 0  . bifidobacterium infantis (ALIGN) capsule Take by mouth.    . chlorhexidine (HIBICLENS) 4 % external liquid Uses every day    . glimepiride (AMARYL) 2 MG tablet Take 2-4 mg by mouth See admin instructions. Take 4 mg in the morning and 2 mg in the evening    . glucose blood (ONE TOUCH ULTRA TEST) test strip USE THREE TIMES DAILY    . Insulin Glargine (BASAGLAR KWIKPEN) 100 UNIT/ML SOPN Inject 20-40 Units into the skin See admin instructions. Take 40 units  in the morning and 20 units in the evening  12  . IRON-VITAMIN C PO Take 1 tablet by mouth daily.    Marland Kitchen lactulose (CHRONULAC) 10 GM/15ML solution Take by mouth.    . lactulose, encephalopathy, (CHRONULAC) 10 GM/15ML SOLN Take 10 g by mouth daily.     Marland Kitchen  loratadine (CLARITIN) 10 MG tablet Take 10 mg by mouth daily.    . magnesium oxide (MAG-OX) 400 MG tablet Take by mouth.    . metolazone (ZAROXOLYN) 2.5 MG tablet TAKE ONE TABLET BY MOUTH ONCE A WEEK    . ONETOUCH DELICA LANCETS 02I MISC AS DIRECTED    . polyethylene glycol (MIRALAX / GLYCOLAX) 17 g packet Take 17 g by mouth daily.    . potassium chloride SA (KLOR-CON M20) 20 MEQ tablet TAKE 1 TABLET BY MOUTH FOUR TIMES A DAY    . spironolactone (ALDACTONE) 25 MG tablet Take 50 mg by mouth daily.    Marland Kitchen torsemide (DEMADEX) 20 MG tablet Take 30 mg by mouth daily.     Marland Kitchen triamcinolone cream (KENALOG) 0.1 % APPLY TO AFFECTED AREA TWICE A DAY    . TURMERIC PO Take 1,000 mg by mouth 2 (two) times daily.  No current facility-administered medications for this visit.      PHYSICAL EXAMINATION: ECOG PERFORMANCE STATUS: 1 - Symptomatic but completely ambulatory Vitals:   04/06/19 1057  BP: 118/66  Pulse: 81  Resp: 18  Temp: 98.2 F (36.8 C)   Filed Weights   04/06/19 1057  Weight: 245 lb 1.6 oz (111.2 kg)   Physical examination was performed while patient is sitting.  He has difficulty getting onto the examination table due to hip pain. Physical Exam Constitutional:      General: He is not in acute distress. HENT:     Head: Normocephalic and atraumatic.  Eyes:     General: No scleral icterus.    Pupils: Pupils are equal, round, and reactive to light.  Neck:     Musculoskeletal: Normal range of motion and neck supple.  Cardiovascular:     Rate and Rhythm: Normal rate and regular rhythm.     Heart sounds: Normal heart sounds.  Pulmonary:     Effort: Pulmonary effort is normal. No respiratory distress.     Breath sounds: No wheezing.  Abdominal:     General: Bowel sounds are normal. There is no distension.     Palpations: Abdomen is soft.  Musculoskeletal:        General: No deformity.     Left lower leg: Edema present.  Skin:    General: Skin is warm and dry.     Findings:  No erythema or rash.  Neurological:     Mental Status: He is alert and oriented to person, place, and time.     Cranial Nerves: No cranial nerve deficit.     Coordination: Coordination normal.  Psychiatric:        Behavior: Behavior normal.        Thought Content: Thought content normal.      LABORATORY DATA:  I have reviewed the data as listed Lab Results  Component Value Date   WBC 5.7 04/06/2019   HGB 11.5 (L) 04/06/2019   HCT 31.0 (L) 04/06/2019   MCV 93.1 04/06/2019   PLT 55 (L) 04/06/2019   Recent Labs    03/26/19 0901 04/06/19 1203  NA 138 132*  K 4.4 4.3  CL 109 102  CO2 20* 24  GLUCOSE 155* 197*  BUN 28* 28*  CREATININE 1.19 1.46*  CALCIUM 8.3* 8.4*  GFRNONAA >60 50*  GFRAA >60 58*  PROT  --  7.8  ALBUMIN  --  3.2*  AST  --  40  ALT  --  28  ALKPHOS  --  134*  BILITOT  --  3.0*   Iron/TIBC/Ferritin/ %Sat No results found for: IRON, TIBC, FERRITIN, IRONPCTSAT    RADIOGRAPHIC STUDIES: I have personally reviewed the radiological images as listed and agreed with the findings in the report.  No results found. 07/29/2009 CT Abdomen Pelvis w contrast.  Mild Splenomegaly.   ASSESSMENT & PLAN:  1. Thrombocytopenia ([place])   2. Cirrhosis of liver without ascites, unspecified hepatic cirrhosis type ([facility])   3. Splenomegaly   4. Stage 3a chronic kidney disease   5. Anemia, unspecified type    # Thrombocytopenia. History of DITP from vancomycin and Clindamycin ? thrombocytopenia secondary to chronic liver disease.Rule out other etiologies. For the work up of patient's thrombocytopenia, I recommend checking CBC;CMP, LDH; smear review,PTT, PT folate, Vitamin B12, hepatitis, HIV,,  Flowcytometry, immature platelet fraction, Will also check ultrasound of the abdomen.  # Cirrhosis, ammonia level is high, on lactulose. Aldactone, -recommend US  liver surveillance every 6 months # CKD on torsemide metolazone avoid nephrotoxin.  # Labs are reviewed.  Negative HIV,  Hepatitis panel, inappropriately normal immature platelet fraction- indicating underproduction,  Flowcytometry pending. Adequate B12 and folate. Normal LDH Anemia, unknown etiology, in the CKD setting, will need obtain Multiple myeloma panel.   # Patient follow-up with me in approximately 2 weeks to review the above results.   Orders Placed This Encounter  Procedures  . Lactate dehydrogenase    Standing Status:   Future    Number of Occurrences:   1    Standing Expiration Date:   04/05/2020  . Retic Panel    Standing Status:   Future    Number of Occurrences:   1    Standing Expiration Date:   04/05/2020  . CBC with Differential/Platelet    Standing Status:   Future    Number of Occurrences:   1    Standing Expiration Date:   04/05/2020  . Comprehensive metabolic panel    Standing Status:   Future    Number of Occurrences:   1    Standing Expiration Date:   04/05/2020  . Vitamin B12    Standing Status:   Future    Number of Occurrences:   1    Standing Expiration Date:   04/05/2020  . Folate    Standing Status:   Future    Number of Occurrences:   1    Standing Expiration Date:   04/05/2020  . Comp panel: Leukemia/Lymphoma    Standing Status:   Future    Number of Occurrences:   1    Standing Expiration Date:   04/05/2020  . Immature Platelet Fraction    Standing Status:   Future    Number of Occurrences:   1    Standing Expiration Date:   04/05/2020  . Hepatitis panel, acute    Standing Status:   Future    Number of Occurrences:   1    Standing Expiration Date:   04/05/2020  . HIV ANTIBODY (ROUTINE TETSING W RELFEX)    Standing Status:   Future    Number of Occurrences:   1    Standing Expiration Date:   04/05/2020  . Protime-INR    Standing Status:   Future    Number of Occurrences:   1    Standing Expiration Date:   04/05/2020  . APTT    Standing Status:   Future    Number of Occurrences:   1    Standing Expiration Date:   04/05/2020    All questions were answered. The  patient knows to call the clinic with any problems questions or concerns.  Cc Hessie Knows, MD  Return of visit: 2 weeks.  Thank you for this kind referral and the opportunity to participate in the care of this patient. A copy of today's note is routed to referring provider  Total face to face encounter time for this patient visit was 60 min. >50% of the time was  spent in counseling and coordination of care.    Earlie Server, MD, PhD 04/06/2019

## 2019-04-09 ENCOUNTER — Telehealth: Payer: Self-pay

## 2019-04-09 ENCOUNTER — Other Ambulatory Visit: Payer: Self-pay

## 2019-04-09 ENCOUNTER — Inpatient Hospital Stay: Payer: Managed Care, Other (non HMO)

## 2019-04-09 DIAGNOSIS — D649 Anemia, unspecified: Secondary | ICD-10-CM

## 2019-04-09 DIAGNOSIS — N1831 Chronic kidney disease, stage 3a: Secondary | ICD-10-CM

## 2019-04-09 DIAGNOSIS — D693 Immune thrombocytopenic purpura: Secondary | ICD-10-CM | POA: Diagnosis not present

## 2019-04-09 NOTE — Telephone Encounter (Signed)
-----  Message from Earlie Server, MD sent at 04/06/2019 10:33 PM EST ----- Please advise patient that I have reviewed some of his blood work and I recommend him to do additional testing. - multiple myeloma panel/light chain- ordered. I will discuss results with him during next visit. Thank you

## 2019-04-09 NOTE — Telephone Encounter (Signed)
Pt's wife notified and accepts lab appointment for today(11/9) at 11:30.

## 2019-04-10 ENCOUNTER — Ambulatory Visit: Payer: Managed Care, Other (non HMO) | Attending: Infectious Diseases | Admitting: Infectious Diseases

## 2019-04-10 ENCOUNTER — Encounter: Payer: Self-pay | Admitting: Infectious Diseases

## 2019-04-10 VITALS — BP 117/68 | HR 81 | Ht 69.0 in | Wt 240.0 lb

## 2019-04-10 DIAGNOSIS — K7581 Nonalcoholic steatohepatitis (NASH): Secondary | ICD-10-CM

## 2019-04-10 DIAGNOSIS — D696 Thrombocytopenia, unspecified: Secondary | ICD-10-CM

## 2019-04-10 DIAGNOSIS — Z22322 Carrier or suspected carrier of Methicillin resistant Staphylococcus aureus: Secondary | ICD-10-CM

## 2019-04-10 DIAGNOSIS — K7460 Unspecified cirrhosis of liver: Secondary | ICD-10-CM

## 2019-04-10 DIAGNOSIS — M1612 Unilateral primary osteoarthritis, left hip: Secondary | ICD-10-CM

## 2019-04-10 DIAGNOSIS — Z8614 Personal history of Methicillin resistant Staphylococcus aureus infection: Secondary | ICD-10-CM

## 2019-04-10 DIAGNOSIS — Z888 Allergy status to other drugs, medicaments and biological substances status: Secondary | ICD-10-CM

## 2019-04-10 DIAGNOSIS — G4730 Sleep apnea, unspecified: Secondary | ICD-10-CM

## 2019-04-10 DIAGNOSIS — E119 Type 2 diabetes mellitus without complications: Secondary | ICD-10-CM | POA: Diagnosis not present

## 2019-04-10 DIAGNOSIS — Z881 Allergy status to other antibiotic agents status: Secondary | ICD-10-CM

## 2019-04-10 DIAGNOSIS — I878 Other specified disorders of veins: Secondary | ICD-10-CM

## 2019-04-10 DIAGNOSIS — K7469 Other cirrhosis of liver: Secondary | ICD-10-CM

## 2019-04-10 LAB — COMP PANEL: LEUKEMIA/LYMPHOMA

## 2019-04-10 LAB — KAPPA/LAMBDA LIGHT CHAINS
Kappa free light chain: 81.2 mg/L — ABNORMAL HIGH (ref 3.3–19.4)
Kappa, lambda light chain ratio: 1.39 (ref 0.26–1.65)
Lambda free light chains: 58.5 mg/L — ABNORMAL HIGH (ref 5.7–26.3)

## 2019-04-10 NOTE — Progress Notes (Signed)
NAME: Nathan Schultz  DOB: November 17, 1955  MRN: [id number]  Date/Time: 04/10/2019 11:35 AM  REQUESTING PROVIDER: Dr. Rudene Christians Subjective:  REASON FOR CONSULT: MRSA in the urine ? Nathan Schultz is a 63 y.o. 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 Patient has left hip arthritis and is seeing orthopedics and was scheduled to have surgery on 04/03/2019 when it was canceled because he was found to be thrombocytopenic.  Patient has had thrombocytopenia since 2010.  He was hospitalized for an infection at that time and his platelet count dropped 2000.  It was thought that certain medications could have caused thrombocytopenia .  He was on vancomycin, Clinda and Doxy and heparin and they were all questioned.  There was no heparin-induced thrombocytopenic antibodies. Patient at the time was seen by Dr. Clayborn Bigness infectious disease then.  I do not see any of those records in the chart but patient's wife has a copy of those records. During that hospitalization patient was also given IVIG and steroids for the thrombocytopenia. 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  HIV was negative so was lupus.  Recently started having pain in his left hip and was scheduled to undergo a hip replacement by Dr. Rudene Christians.  As part of the work-up labs were done and the platelet count was found to be 55 and his surgery was canceled.  He is now referred back to heme-onc.  He saw Dr. Tasia Catchings and is being worked up for thrombocytopenia. On reviewing his records there is also mention of hepatic encephalopathy and cirrhosis secondary to nonalcoholic steatohepatitis.  He is currently on triple  diuretics.  Patient does not complain of any fever No difficulty passing urine. No GI bleed Yesterday he was seen by the dentist for tooth pain and has been given amoxicillin  Patient has been using Hibiclens body wash and mupirocin in the nares. Past Medical History:  Diagnosis Date  . Anemia   . Arthritis   . Cellulitis    legs feet and hand  . Diabetes mellitus without complication ([place])   . History of ITP   . History of methicillin resistant staphylococcus aureus (MRSA)   . Liver cirrhosis secondary to nonalcoholic steatohepatitis (NASH) ([place])   . OSA (obstructive sleep apnea)    no CPAP couldn't tolerate  . Seasonal allergies   Hepatic encephalopathy  Past Surgical History:  Procedure Laterality Date  . CERVICAL FUSION    . CHOLECYSTECTOMY    . ROTATOR CUFF REPAIR Right   . TONSILLECTOMY      Social History   Socioeconomic History  . Marital status: Married    Spouse name: Not on file  . Number of children: Not on file  . Years of education: Not on file  . Highest education level: Not on file  Occupational History  . Not on file  Social Needs  . Financial resource strain: Not on file  . Food insecurity    Worry: Not on file    Inability: Not on file  . Transportation needs    Medical: Not on file    Non-medical: Not on file  Tobacco Use  . Smoking status: Never Smoker  . Smokeless tobacco: Never Used  Substance and Sexual Activity  . Alcohol use: No    Alcohol/week: 0.0 standard drinks    Frequency: Never  . Drug use: No  . Sexual activity: Not on file  Lifestyle  . Physical activity    Days per week: Not on file    Minutes per session: Not on file  . Stress: Not on file  Relationships  . Social Herbalist on phone: Not on file    Gets together: Not on file    Attends religious service: Not on file    Active member of club or organization: Not on file    Attends meetings of clubs or organizations: Not on file    Relationship status:  Not on file  . Intimate partner violence    Fear of current or ex partner: Not on file    Emotionally abused: Not on file    Physically abused: Not on file    Forced sexual activity: Not on file  Other Topics Concern  . Not on file  Social History Narrative  . Not on file    Family History  Problem Relation Age of Onset  . CAD Father   . Diabetes Brother    Allergies  Allergen Reactions  . Clindamycin Other (See Comments)    Low Platelet   . Doxycycline Other (See Comments)    Low Platelet   . Triamterene-Hctz Other (See Comments)    Low platelets  . Vancomycin Other (See Comments)    Low Platelet    ? Current Outpatient Medications  Medication Sig Dispense Refill  . albuterol (PROVENTIL HFA;VENTOLIN HFA) 108 (90 Base) MCG/ACT inhaler Inhale 2 puffs into the lungs every 4 (four) hours as needed for wheezing or shortness of breath (wheezing, cough, shortness of breath). 1 Inhaler 0  . amoxicillin (AMOXIL) 500 MG capsule Take 500 mg by mouth 3 (three) times daily.    . bifidobacterium infantis (ALIGN) capsule Take by mouth.    . chlorhexidine (HIBICLENS) 4 % external liquid Uses every day    . glimepiride (AMARYL) 2 MG tablet Take 2-4 mg by mouth See admin instructions. Take 4 mg in the morning and 2 mg in the evening    . glucose blood (ONE TOUCH ULTRA TEST) test strip USE THREE TIMES DAILY    . Insulin Glargine (BASAGLAR KWIKPEN) 100 UNIT/ML SOPN Inject 20-40 Units into the skin See admin instructions. Take 40 units  in the morning and 20 units in the evening  12  . IRON-VITAMIN C PO Take 1 tablet by mouth daily.    Marland Kitchen lactulose, encephalopathy, (CHRONULAC) 10 GM/15ML SOLN Take 10 g by mouth daily.     Marland Kitchen loratadine (CLARITIN) 10 MG tablet Take 10 mg by mouth daily.    . magnesium oxide (MAG-OX) 400 MG tablet Take by mouth.    . metolazone (ZAROXOLYN) 2.5 MG tablet TAKE ONE TABLET BY MOUTH ONCE A WEEK    . mupirocin cream (BACTROBAN) 2 % Apply 1 application topically daily.     Glory Rosebush DELICA LANCETS 84T MISC AS DIRECTED    . polyethylene glycol (MIRALAX / GLYCOLAX) 17 g packet Take 17 g by mouth daily.    . potassium chloride SA (KLOR-CON M20) 20 MEQ tablet TAKE 1 TABLET BY MOUTH FOUR TIMES A DAY    . spironolactone (ALDACTONE) 25 MG tablet Take 50 mg by mouth daily.    Marland Kitchen torsemide (DEMADEX) 20 MG tablet Take 30 mg by mouth daily.     Marland Kitchen  triamcinolone cream (KENALOG) 0.1 % APPLY TO AFFECTED AREA TWICE A DAY    . TURMERIC PO Take 1,000 mg by mouth 2 (two) times daily.     No current facility-administered medications for this visit.      Abtx:  Anti-infectives (From admission, onward)   None      REVIEW OF SYSTEMS:  Const: negative fever, negative chills, negative weight loss Eyes: negative diplopia or visual changes, negative eye pain ENT: negative coryza, negative sore throat Resp: negative cough, hemoptysis, dyspnea Cards: negative for chest pain, palpitations, lower extremity edema GU: negative for frequency, dysuria and hematuria GI: Negative for abdominal pain, diarrhea, bleeding, constipation Skin: negative for rash and pruritus Heme: negative for easy bruising and gum/nose bleeding MS: Left hip pain walks with a walker Neurolo:negative for headaches, dizziness, vertigo, memory problems  Psych: negative for feelings of anxiety, depression  Endocrine: Diabetes  Allergy/Immunology-as above Objective:  VITALS:  BP 117/68   Pulse 81   Ht 5' 9"  (1.753 m)   Wt 240 lb (108.9 kg)   BMI 35.44 kg/m  PHYSICAL EXAM:  General: Alert, cooperative, no distress, appears stated age.  Weak, walks with a walker Head: Normocephalic, without obvious abnormality, atraumatic. Eyes: Conjunctivae clear, anicteric sclerae. Pupils are equal ENT not examined due to mask Neck: Supple, symmetrical, no adenopathy, thyroid: non tender no carotid bruit and no JVD. Back: No CVA tenderness. Lungs: Clear to auscultation bilaterally. No Wheezing or Rhonchi. No  rales. Heart: Regular rate and rhythm, no murmur, rub or gallop. Abdomen: Soft, non-tender,not distended. Bowel sounds normal. No masses Extremities: Edema legs.,  Venous pigmentation of the legs.  No  erythema or lesions. Skin: No rashes or lesions. Or bruising Lymph: Cervical, supraclavicular normal. Neurologic: Grossly non-focal Pertinent Labs Lab Results CBC    Component Value Date/Time   WBC 5.7 04/06/2019 1203   RBC 3.33 (L) 04/06/2019 1203   RBC 3.33 (L) 04/06/2019 1203   HGB 11.5 (L) 04/06/2019 1203   HGB 12.1 (L) 12/11/2013 0508   HCT 31.0 (L) 04/06/2019 1203   HCT 34.5 (L) 12/11/2013 0508   PLT 55 (L) 04/06/2019 1203   PLT 59 (L) 12/11/2013 0508   MCV 93.1 04/06/2019 1203   MCV 97 12/11/2013 0508   MCH 34.5 (H) 04/06/2019 1203   MCHC 37.1 (H) 04/06/2019 1203   RDW 13.8 04/06/2019 1203   RDW 13.5 12/11/2013 0508   LYMPHSABS 1.2 04/06/2019 1203   LYMPHSABS 1.5 12/11/2013 0508   MONOABS 0.6 04/06/2019 1203   MONOABS 0.7 12/11/2013 0508   EOSABS 0.3 04/06/2019 1203   EOSABS 0.2 12/11/2013 0508   BASOSABS 0.1 04/06/2019 1203   BASOSABS 0.0 12/11/2013 0508    CMP Latest Ref Rng & Units 04/06/2019 03/26/2019 07/07/2017  Glucose 70 - 99 mg/dL 197(H) 155(H) 247(H)  BUN 8 - 23 mg/dL 28(H) 28(H) 52(H)  Creatinine 0.61 - 1.24 mg/dL 1.46(H) 1.19 1.71(H)  Sodium 135 - 145 mmol/L 132(L) 138 135  Potassium 3.5 - 5.1 mmol/L 4.3 4.4 3.5  Chloride 98 - 111 mmol/L 102 109 96(L)  CO2 22 - 32 mmol/L 24 20(L) 28  Calcium 8.9 - 10.3 mg/dL 8.4(L) 8.3(L) 8.2(L)  Total Protein 6.5 - 8.1 g/dL 7.8 - -  Total Bilirubin 0.3 - 1.2 mg/dL 3.0(H) - -  Alkaline Phos 38 - 126 U/L 134(H) - -  AST 15 - 41 U/L 40 - -  ALT 0 - 44 U/L 28 - -      Microbiology:  03/26/2019.  Nares MRSA positive Urine culture MRSA positive  IMAGING RESULTS: No images to review ? Impression/Recommendation ? 63 year old male with multiple comorbidities.  History of MRSA infection and  colonization. Currently getting Hibiclens and mupirocin. The MRSA in the urine very likely is a contaminant from the skin.  He does not have any urinary tract symptoms.  Neither does he have any systemic symptoms suggest bacteremia .  Thrombocytopenia: Likely ITP.  His platelet has been ranging less than 80 but predominantly in the 50s.  He has seen Dr. Tasia Catchings on 04/06/2019.  And multiple labs have been sent.  He has a follow-up with the on the 20th.  Multiple antibiotics including vancomycin, doxycycline and clindamycin have been reported as causing thrombocytopenia.  I doubt it is any of the medicines that caused it.  It must have been a coincidental finding in 2010. We will discuss with Dr. Tasia Catchings to see whether this was due to ITP and not the antibiotics   Liver cirrhosis secondary to NASH.  He is on MMM diuretics including metolazone, spironolactone and torsemide.  At one time in his chart there is a mention of hepatic encephalopathy.  Would recommend clearance from hepatology as anesthesia and other pain medications given during surgery and post can cause him to decompensate.  Left hip arthritis plan for THA.  Diabetes mellitus ? ?Discussed with patient and his wife in great detail. Wife will call me after she has been cleared by Dr. Tasia Catchings for surgery.  Then I would send swabs of nares axilla and inguinal area to look for MRSA.  He will continue until then Hibiclens and mupirocin. Also I need to figure out what antibiotic to use for surgical prophylaxis.  Not sure whether vancomycin and clindamycin can be used even though I doubt they cause thrombocytopenia wife is very concerned and likely does not want it to be used.  We may have to think of daptomycin.  Did discuss with him and his wife that he is high risk for surgical site infection including prosthetic joint infection because of colonization with MRSA, cirrhosis, thrombocytopenia and diabetes. Spent more than 50% of the 45-minute visit, counseling  patient about his condition, prognosis and risk for prosthetic joint infection. Note:  This document was prepared using Dragon voice recognition software and may include unintentional dictation errors.

## 2019-04-10 NOTE — Patient Instructions (Addendum)
You have been referred to me by Dr.Menz for MRSA in the urine You have been colonized with MRSA in your nares and using hibiclens and Mupirocin Before your hip surgery you need to get clearance for low platelet and also for liver cirrhosis. We will repeat nares, inguinal , and axillary swab for MRSA- also your urine MRSA is likely skin contamination. As there is a concern for vancomycin induced low platelet we need to use another IV antibiotic for surgical prophylaxis. I will check with Dr.Yu as well Please call my clinic and make an appt once you have been cleared to undergo surgery by heme/onc-

## 2019-04-11 ENCOUNTER — Telehealth: Payer: Self-pay | Admitting: *Deleted

## 2019-04-11 LAB — MULTIPLE MYELOMA PANEL, SERUM
Albumin SerPl Elph-Mcnc: 3 g/dL (ref 2.9–4.4)
Albumin/Glob SerPl: 0.9 (ref 0.7–1.7)
Alpha 1: 0.2 g/dL (ref 0.0–0.4)
Alpha2 Glob SerPl Elph-Mcnc: 0.4 g/dL (ref 0.4–1.0)
B-Globulin SerPl Elph-Mcnc: 1.1 g/dL (ref 0.7–1.3)
Gamma Glob SerPl Elph-Mcnc: 2 g/dL — ABNORMAL HIGH (ref 0.4–1.8)
Globulin, Total: 3.6 g/dL (ref 2.2–3.9)
IgA: 1015 mg/dL — ABNORMAL HIGH (ref 61–437)
IgG (Immunoglobin G), Serum: 2046 mg/dL — ABNORMAL HIGH (ref 603–1613)
IgM (Immunoglobulin M), Srm: 187 mg/dL — ABNORMAL HIGH (ref 20–172)
Total Protein ELP: 6.6 g/dL (ref 6.0–8.5)

## 2019-04-11 NOTE — Telephone Encounter (Signed)
Yes please. Thank you

## 2019-04-11 NOTE — Telephone Encounter (Signed)
Wife called asking if she needs to call patient PCP about getting auth for [place] of spleen and liver. Please advise

## 2019-04-11 NOTE — Telephone Encounter (Signed)
Dr. Tasia Catchings, your last office visit note says get a [place] of abdomen, but I don's see an order. Did you want to go ahead and order [place]?

## 2019-04-12 ENCOUNTER — Other Ambulatory Visit: Payer: Self-pay

## 2019-04-12 DIAGNOSIS — K7460 Unspecified cirrhosis of liver: Secondary | ICD-10-CM

## 2019-04-12 NOTE — Telephone Encounter (Signed)
Call returned to [facility] and I left message on voice mail that we will order the [place] and call once the approval from insurance has be obtained

## 2019-04-12 NOTE — Telephone Encounter (Signed)
No need for pt to do anything. Order for [place] abd has been enterered and it will go through insurance for authorization. Pt will receive call with appt once scheduled.

## 2019-04-18 ENCOUNTER — Other Ambulatory Visit: Payer: Self-pay

## 2019-04-18 ENCOUNTER — Ambulatory Visit
Admission: RE | Admit: 2019-04-18 | Discharge: 2019-04-18 | Disposition: A | Discharge status: Home / Self Care | Payer: Managed Care, Other (non HMO) | Source: Ambulatory Visit | Attending: Oncology | Admitting: Oncology

## 2019-04-18 DIAGNOSIS — K7460 Unspecified cirrhosis of liver: Secondary | ICD-10-CM | POA: Diagnosis not present

## 2019-04-19 ENCOUNTER — Encounter: Payer: Self-pay | Admitting: Oncology

## 2019-04-19 ENCOUNTER — Other Ambulatory Visit: Payer: Self-pay

## 2019-04-19 NOTE — Progress Notes (Signed)
Patient wants his U/S results done 04/18/2019.

## 2019-04-20 ENCOUNTER — Other Ambulatory Visit: Payer: Self-pay

## 2019-04-20 ENCOUNTER — Inpatient Hospital Stay (HOSPITAL_BASED_OUTPATIENT_CLINIC_OR_DEPARTMENT_OTHER): Payer: Managed Care, Other (non HMO) | Admitting: Oncology

## 2019-04-20 VITALS — BP 122/68 | HR 76 | Temp 97.6°F | Resp 18 | Wt 244.2 lb

## 2019-04-20 DIAGNOSIS — N1831 Chronic kidney disease, stage 3a: Secondary | ICD-10-CM | POA: Diagnosis not present

## 2019-04-20 DIAGNOSIS — R161 Splenomegaly, not elsewhere classified: Secondary | ICD-10-CM | POA: Diagnosis not present

## 2019-04-20 DIAGNOSIS — D696 Thrombocytopenia, unspecified: Secondary | ICD-10-CM

## 2019-04-20 DIAGNOSIS — K7460 Unspecified cirrhosis of liver: Secondary | ICD-10-CM

## 2019-04-20 DIAGNOSIS — D693 Immune thrombocytopenic purpura: Secondary | ICD-10-CM | POA: Diagnosis not present

## 2019-04-20 NOTE — Progress Notes (Signed)
Hematology/Oncology Consult note [facility] [phone] [phone]   Patient Care Team: Rusty Aus, MD as PCP - General (Internal Medicine)  REFERRING PROVIDER: Rusty Aus, MD  CHIEF COMPLAINTS/REASON FOR VISIT:  Evaluation of thrombocytopenia  HISTORY OF PRESENTING ILLNESS:  Nathan Schultz is a 63 y.o. male who was seen in consultation at the request of Rusty Aus, MD for evaluation of thrombocytopenia . Patient was scheduled for left total hip arthroplasty but had to be canceled due to low platelet counts  Reviewed patient's labs done previously.  03/26/2019 labs showed decreased platelet counts at 49,000. wbc 5 hemoglobin 10.9 Patient was accompanied by wife.   Per patient and wife, patient has a history of drug induced immune thrombocytopenia and was seen at the cancer center in 2000 02/2010. Wife brought previous records. Extensive medical records reviewed were performed by me. Patient developed cellulitis/bacteremia and was treated with antibiotics with vancomycin and clindamycin. At that time patient developed thrombocytopenia, with platelet counts of 8000. Patient was treated with IVIG, slow steroid tapering course and his platelet counts responded to the treatments.  Recovered to 100s. Patient has had a bone marrow biopsy done as well which reviewed and no malignancy.  Patient also had a history of cirrhosis.  Previously had liver biopsy which showed chronic hepatitis. Cirrhosis-NASH.    Patient has not followed up with gastroenterology for years. He has a history of splenomegaly, detected on previous images.  Patient reports feeling well at baseline.  Easy bruising.  Denies any night sweats, fever, chills, unintentional weight loss. He has left hip pain and is considering hip replacement surgery.  Needs surgical clearance.  INTERVAL HISTORY Nathan Schultz is a 63 y.o. male who has above history reviewed by me today  presents for follow up visit for management of thrombocytopenia Problems and complaints are listed below: Patient had work-up done present to discuss lab results and management plan.  Accompanied by wife.  No new complaints.  Review of Systems  Constitutional: Positive for fatigue. Negative for appetite change, chills, fever and unexpected weight change.  HENT:   Negative for hearing loss and voice change.   Eyes: Negative for eye problems and icterus.  Respiratory: Negative for chest tightness, cough and shortness of breath.   Cardiovascular: Negative for chest pain and leg swelling.  Gastrointestinal: Negative for abdominal distention and abdominal pain.  Endocrine: Negative for hot flashes.  Genitourinary: Negative for difficulty urinating, dysuria and frequency.   Musculoskeletal: Positive for arthralgias.  Skin: Negative for itching and rash.  Neurological: Negative for light-headedness and numbness.  Hematological: Negative for adenopathy. Bruises/bleeds easily.  Psychiatric/Behavioral: Negative for confusion.    MEDICAL HISTORY:  Past Medical History:  Diagnosis Date  . Anemia   . Arthritis   . Cellulitis    legs feet and hand  . Diabetes mellitus without complication ([place])   . History of ITP   . History of methicillin resistant staphylococcus aureus (MRSA)   . Liver cirrhosis secondary to nonalcoholic steatohepatitis (NASH) ([place])   . OSA (obstructive sleep apnea)    no CPAP couldn't tolerate  . Seasonal allergies     SURGICAL HISTORY: Past Surgical History:  Procedure Laterality Date  . CERVICAL FUSION    . CHOLECYSTECTOMY    . ROTATOR CUFF REPAIR Right   . TONSILLECTOMY      SOCIAL HISTORY: Social History   Socioeconomic History  . Marital status: Married    Spouse name: Not on file  .  Number of children: Not on file  . Years of education: Not on file  . Highest education level: Not on file  Occupational History  . Not on file  Social Needs  .  Financial resource strain: Not on file  . Food insecurity    Worry: Not on file    Inability: Not on file  . Transportation needs    Medical: Not on file    Non-medical: Not on file  Tobacco Use  . Smoking status: Never Smoker  . Smokeless tobacco: Never Used  Substance and Sexual Activity  . Alcohol use: No    Alcohol/week: 0.0 standard drinks    Frequency: Never  . Drug use: No  . Sexual activity: Not on file  Lifestyle  . Physical activity    Days per week: Not on file    Minutes per session: Not on file  . Stress: Not on file  Relationships  . Social Herbalist on phone: Not on file    Gets together: Not on file    Attends religious service: Not on file    Active member of club or organization: Not on file    Attends meetings of clubs or organizations: Not on file    Relationship status: Not on file  . Intimate partner violence    Fear of current or ex partner: Not on file    Emotionally abused: Not on file    Physically abused: Not on file    Forced sexual activity: Not on file  Other Topics Concern  . Not on file  Social History Narrative  . Not on file    FAMILY HISTORY: Family History  Problem Relation Age of Onset  . CAD Father   . Diabetes Brother     ALLERGIES:  is allergic to clindamycin; doxycycline; triamterene-hctz; and vancomycin.  MEDICATIONS:  Current Outpatient Medications  Medication Sig Dispense Refill  . albuterol (PROVENTIL HFA;VENTOLIN HFA) 108 (90 Base) MCG/ACT inhaler Inhale 2 puffs into the lungs every 4 (four) hours as needed for wheezing or shortness of breath (wheezing, cough, shortness of breath). 1 Inhaler 0  . bifidobacterium infantis (ALIGN) capsule Take 1 capsule by mouth 2 (two) times daily.     . chlorhexidine (HIBICLENS) 4 % external liquid Uses every day    . glimepiride (AMARYL) 2 MG tablet Take 2-4 mg by mouth See admin instructions. Take 4 mg in the morning and 2 mg in the evening    . glucose blood (ONE TOUCH  ULTRA TEST) test strip USE THREE TIMES DAILY    . Insulin Glargine (BASAGLAR KWIKPEN) 100 UNIT/ML SOPN Inject 20-40 Units into the skin See admin instructions. Take 40 units  in the morning and 20 units in the evening  12  . IRON-VITAMIN C PO Take 1 tablet by mouth 2 (two) times daily.     Marland Kitchen lactulose, encephalopathy, (CHRONULAC) 10 GM/15ML SOLN Take 10 g by mouth daily.     Marland Kitchen loratadine (CLARITIN) 10 MG tablet Take 10 mg by mouth daily.    . magnesium oxide (MAG-OX) 400 MG tablet Take 400 mg by mouth 2 (two) times daily.     . metolazone (ZAROXOLYN) 2.5 MG tablet TAKE ONE TABLET BY MOUTH ONCE A WEEK    . mupirocin cream (BACTROBAN) 2 % Apply 1 application topically daily.    Glory Rosebush DELICA LANCETS 62G MISC AS DIRECTED    . polyethylene glycol (MIRALAX / GLYCOLAX) 17 g packet Take 17 g by  mouth daily.    . potassium chloride SA (KLOR-CON M20) 20 MEQ tablet TAKE 1 TABLET BY MOUTH FOUR TIMES A DAY    . spironolactone (ALDACTONE) 25 MG tablet Take 50 mg by mouth daily.    Marland Kitchen torsemide (DEMADEX) 20 MG tablet Take 30 mg by mouth daily.     Marland Kitchen triamcinolone cream (KENALOG) 0.1 % APPLY TO AFFECTED AREA TWICE A DAY    . TURMERIC PO Take 1,000 mg by mouth 2 (two) times daily.     No current facility-administered medications for this visit.      PHYSICAL EXAMINATION: ECOG PERFORMANCE STATUS: 1 - Symptomatic but completely ambulatory Vitals:   04/19/19 1112  BP: 122/68  Pulse: 76  Resp: 18  Temp: 97.6 F (36.4 C)   Filed Weights   04/19/19 1112  Weight: 244 lb 3.2 oz (110.8 kg)   Physical examination was performed while patient is sitting.  He has difficulty getting onto the examination table due to hip pain. Physical Exam Constitutional:      General: He is not in acute distress. HENT:     Head: Normocephalic and atraumatic.  Eyes:     General: No scleral icterus.    Pupils: Pupils are equal, round, and reactive to light.  Neck:     Musculoskeletal: Normal range of motion and neck  supple.  Cardiovascular:     Rate and Rhythm: Normal rate and regular rhythm.     Heart sounds: Normal heart sounds.  Pulmonary:     Effort: Pulmonary effort is normal. No respiratory distress.     Breath sounds: No wheezing.  Abdominal:     General: Bowel sounds are normal. There is no distension.     Palpations: Abdomen is soft. There is no mass.     Tenderness: There is no abdominal tenderness.  Musculoskeletal: Normal range of motion.        General: No deformity.     Left lower leg: Edema present.  Skin:    General: Skin is warm and dry.     Findings: No erythema or rash.  Neurological:     Mental Status: He is alert and oriented to person, place, and time.     Cranial Nerves: No cranial nerve deficit.     Coordination: Coordination normal.  Psychiatric:        Behavior: Behavior normal.        Thought Content: Thought content normal.      LABORATORY DATA:  I have reviewed the data as listed Lab Results  Component Value Date   WBC 5.7 04/06/2019   HGB 11.5 (L) 04/06/2019   HCT 31.0 (L) 04/06/2019   MCV 93.1 04/06/2019   PLT 55 (L) 04/06/2019   Recent Labs    03/26/19 0901 04/06/19 1203  NA 138 132*  K 4.4 4.3  CL 109 102  CO2 20* 24  GLUCOSE 155* 197*  BUN 28* 28*  CREATININE 1.19 1.46*  CALCIUM 8.3* 8.4*  GFRNONAA >60 50*  GFRAA >60 58*  PROT  --  7.8  ALBUMIN  --  3.2*  AST  --  40  ALT  --  28  ALKPHOS  --  134*  BILITOT  --  3.0*   Iron/TIBC/Ferritin/ %Sat No results found for: IRON, TIBC, FERRITIN, IRONPCTSAT    RADIOGRAPHIC STUDIES: I have personally reviewed the radiological images as listed and agreed with the findings in the report.  US Abdomen Complete  Result Date: 04/18/2019 CLINICAL DATA:  Cirrhosis.  EXAM: ABDOMEN ULTRASOUND COMPLETE COMPARISON:  September 07, 2012. FINDINGS: Gallbladder: Status post cholecystectomy. Common bile duct: Diameter: 4 mm which is within normal limits. Liver: Coarse echotexture of hepatic parenchyma is noted  suggesting diffuse hepatocellular disease. No focal sonographic abnormality is noted. Portal vein is patent on color Doppler imaging with normal direction of blood flow towards the liver. IVC: No abnormality visualized. Pancreas: Visualized portion unremarkable. Spleen: Maximum measured diameter 15 cm with calculated volume of approximately 1500 cubic cm suggesting moderate splenomegaly. No focal abnormality is noted. Right Kidney: Length: 11.7 cm. 1.2 cm simple cyst is seen in upper pole. Echogenicity within normal limits. No mass or hydronephrosis visualized. Left Kidney: Length: 12.3 cm. 1.9 cm simple cyst is seen in lower pole. Echogenicity within normal limits. No mass or hydronephrosis visualized. Abdominal aorta: Proximal abdominal aorta has maximum measured diameter 3.3 cm. Other findings: None. IMPRESSION: Coarse echotexture of hepatic parenchyma is noted suggesting diffuse hepatocellular disease. No focal sonographic hepatic abnormality is noted. Status post cholecystectomy. Probable 3.3 cm proximal abdominal aortic aneurysm. Recommend followup by ultrasound in 3 years. This recommendation follows ACR consensus guidelines: White Paper of the ACR Incidental Findings Committee II on Vascular Findings. Natasha Mead Coll Radiol 2013; [phone] Electronically Signed   By: Marijo Conception M.D.   On: 04/18/2019 10:42   07/29/2009 CT Abdomen Pelvis w contrast.  Mild Splenomegaly.   ASSESSMENT & PLAN:  1. Thrombocytopenia ([place])   2. Cirrhosis of liver without ascites, unspecified hepatic cirrhosis type ([facility])   3. Splenomegaly   4. Stage 3a chronic kidney disease    # Thrombocytopenia. 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 Patient has adequate vitamin B12 and folate level.Normal LDH.  Flow cytometry showed no significant immunophenotypic abnormality.Negative HIV, Hepatitis panel, inappropriately normal immature  platelet fraction- indicating underproduction,  Thrombocytopenia is chronic, most likely secondary to chronic liver disease/hypersplenism. He is current platelet count is 55,000. Discussed with patient and wife that typical platelet threshold for general major surgery (except neurosurgery/ocular surgery) is 50,000, and currently patient is slightly above the threshold.  Recommend patient to have a repeat CBC 2 weeks prior to procedure.  If platelet count is less than 50,000, can consider Thrombopoietin receptor agonist- Avatrombopag to be started 10-12 days prior to procedure and take for 5 days. Perioperative transfusion of platelet to raise count to be above 50,000.    Patient also had coagulopathy with INR 1.4, prolonged PTT.  Recommend caution in transfusing FFP [if INR> 1.8], due to the risk of volume overload which may increase portal pressure. Recommend patient to reestablish care with [facility] gastroenterology clinic to further optimize cirrhosis condition, monitor variceal bleeding, image surveillance. Ultrasound abdomen was independent reviewed by me and discussed with patient and wife.   All questions were answered. The patient knows to call the clinic with any problems questions or concerns.  Cc Rusty Aus, MD  Return of visit: As needed We spent sufficient time to discuss many aspect of care, questions were answered to patient's satisfaction. Total face to face encounter time for this patient visit was 25 min. >50% of the time was  spent in counseling and coordination of care.    Earlie Server, MD, PhD 04/20/2019

## 2019-07-10 ENCOUNTER — Other Ambulatory Visit: Payer: Self-pay | Admitting: Student

## 2019-07-10 DIAGNOSIS — M7522 Bicipital tendinitis, left shoulder: Secondary | ICD-10-CM

## 2019-07-10 DIAGNOSIS — M7582 Other shoulder lesions, left shoulder: Secondary | ICD-10-CM

## 2019-07-19 ENCOUNTER — Ambulatory Visit: Payer: Managed Care, Other (non HMO)

## 2019-08-02 ENCOUNTER — Other Ambulatory Visit: Payer: Self-pay

## 2019-08-02 ENCOUNTER — Ambulatory Visit
Admission: RE | Admit: 2019-08-02 | Discharge: 2019-08-02 | Disposition: A | Discharge status: Home / Self Care | Payer: Medicare HMO | Source: Ambulatory Visit | Attending: Student | Admitting: Student

## 2019-08-02 DIAGNOSIS — M7522 Bicipital tendinitis, left shoulder: Secondary | ICD-10-CM | POA: Diagnosis present

## 2019-08-02 DIAGNOSIS — M7582 Other shoulder lesions, left shoulder: Secondary | ICD-10-CM | POA: Insufficient documentation

## 2019-08-05 DIAGNOSIS — N390 Urinary tract infection, site not specified: Secondary | ICD-10-CM

## 2019-08-05 HISTORY — DX: Urinary tract infection, site not specified: N39.0

## 2019-08-06 ENCOUNTER — Other Ambulatory Visit: Payer: Self-pay | Admitting: Orthopedic Surgery

## 2019-08-13 ENCOUNTER — Other Ambulatory Visit: Payer: Self-pay

## 2019-08-13 ENCOUNTER — Encounter
Admission: RE | Admit: 2019-08-13 | Discharge: 2019-08-13 | Disposition: A | Discharge status: Home / Self Care | Payer: Medicare HMO | Source: Ambulatory Visit | Attending: Orthopedic Surgery | Admitting: Orthopedic Surgery

## 2019-08-13 HISTORY — DX: Chronic kidney disease, unspecified: N18.9

## 2019-08-13 HISTORY — DX: Bronchitis, not specified as acute or chronic: J40

## 2019-08-13 HISTORY — DX: Malignant (primary) neoplasm, unspecified: C80.1

## 2019-08-13 HISTORY — DX: Headache, unspecified: R51.9

## 2019-08-13 HISTORY — DX: Nonrheumatic aortic (valve) stenosis: I35.0

## 2019-08-13 NOTE — Pre-Procedure Instructions (Signed)
Earlie Server, MD  Physician  Oncology     Progress Notes  Signed     Encounter Date:  04/20/2019                  Signed          Expand AllCollapse All            Expand widget buttonCollapse widget button    Show:Clear all   ManualTemplateCopied  Added by:     Earlie Server, MD   Hover for detailscustomization button                                                                                                                                                   untitled image  Hematology/Oncology Consult note  [facility]  [phone] [phone]        Patient Care Team:  Rusty Aus, MD as PCP - General (Internal Medicine)     REFERRING PROVIDER:  Rusty Aus, MD     CHIEF COMPLAINTS/REASON FOR VISIT:   Evaluation of thrombocytopenia     HISTORY OF PRESENTING ILLNESS:   Nathan Schultz is a 64 y.o. male who was seen in consultation at the request of Rusty Aus, MD for evaluation of thrombocytopenia .  Patient was scheduled for left total hip arthroplasty but had to be canceled due to low platelet counts     Reviewed patient's labs done previously.   03/26/2019 labs showed decreased platelet counts at 49,000.  wbc 5 hemoglobin 10.9  Patient was accompanied by wife.    Per patient and wife, patient has a history of drug induced immune thrombocytopenia and was seen at the cancer center in 2000 02/2010.  Wife brought previous records.  Extensive medical records reviewed were performed by me.  Patient developed cellulitis/bacteremia and was treated with antibiotics with vancomycin and clindamycin.  At that time patient developed thrombocytopenia, with platelet counts of 8000.  Patient was treated with IVIG, slow steroid  tapering course and his platelet counts responded to the treatments.  Recovered to 100s.  Patient has had a bone marrow biopsy done as well which reviewed and no malignancy.     Patient also had a history of cirrhosis.  Previously had liver biopsy which showed chronic hepatitis. Cirrhosis-NASH.    Patient has not followed up with gastroenterology for years.  He has a history of splenomegaly, detected on previous images.     Patient reports feeling well at baseline.  Easy bruising.  Denies any night sweats, fever, chills, unintentional weight loss.  He has left hip pain and is considering hip replacement surgery.  Needs surgical clearance.     INTERVAL HISTORY  Nathan Schultz is a 64 y.o. male who has above history reviewed by me today presents for follow up visit for  management of thrombocytopenia  Problems and complaints are listed below:  Patient had work-up done present to discuss lab results and management plan.  Accompanied by wife.  No new complaints.     Review of Systems   Constitutional: Positive for fatigue. Negative for appetite change, chills, fever and unexpected weight change.   HENT:   Negative for hearing loss and voice change.    Eyes: Negative for eye problems and icterus.   Respiratory: Negative for chest tightness, cough and shortness of breath.    Cardiovascular: Negative for chest pain and leg swelling.   Gastrointestinal: Negative for abdominal distention and abdominal pain.   Endocrine: Negative for hot flashes.   Genitourinary: Negative for difficulty urinating, dysuria and frequency.    Musculoskeletal: Positive for arthralgias.   Skin: Negative for itching and rash.   Neurological: Negative for light-headedness and numbness.   Hematological: Negative for adenopathy. Bruises/bleeds easily.   Psychiatric/Behavioral: Negative for confusion.         MEDICAL HISTORY:        Past Medical History:    Diagnosis   Date    .    Anemia        .   Arthritis        .   Cellulitis            legs feet and hand    .   Diabetes mellitus without complication ([place])        .   History of ITP        .   History of methicillin resistant staphylococcus aureus (MRSA)        .   Liver cirrhosis secondary to nonalcoholic steatohepatitis (NASH) ([place])        .   OSA (obstructive sleep apnea)            no CPAP couldn't tolerate    .   Seasonal allergies              SURGICAL HISTORY:        Past Surgical History:    Procedure   Laterality   Date    .   CERVICAL FUSION            .   CHOLECYSTECTOMY            .   ROTATOR CUFF REPAIR   Right        .   TONSILLECTOMY                  SOCIAL HISTORY:   Social History             Socioeconomic History    .   Marital status:   Married            Spouse name:   Not on file    .   Number of children:   Not on file    .   Years of education:   Not on file    .   Highest education level:   Not on file    Occupational History    .   Not on file    Social Needs    .   Financial resource strain:   Not on file    .   Food insecurity            Worry:   Not on file            Inability:  Not on file    .   Transportation needs            Medical:   Not on file            Non-medical:   Not on file    Tobacco Use    .   Smoking status:   Never Smoker    .   Smokeless tobacco:   Never Used    Substance and Sexual Activity    .   Alcohol use:   No            Alcohol/week:   0.0 standard drinks            Frequency:   Never    .   Drug use:   No    .   Sexual activity:   Not on file    Lifestyle    .   Physical activity            Days per week:   Not on file            Minutes per session:   Not on  file    .   Stress:   Not on file    Relationships    .   Social Airline pilot on phone:   Not on file            Gets together:   Not on file            Attends religious service:   Not on file            Active member of club or organization:   Not on file            Attends meetings of clubs or organizations:   Not on file            Relationship status:   Not on file    .   Intimate partner violence            Fear of current or ex partner:   Not on file            Emotionally abused:   Not on file            Physically abused:   Not on file            Forced sexual activity:   Not on file    Other Topics   Concern    .   Not on file    Social History Narrative    .   Not on file          FAMILY HISTORY:        Family History    Problem   Relation   Age of Onset    .   CAD   Father        .   Diabetes   Brother              ALLERGIES:  is allergic to clindamycin; doxycycline; triamterene-hctz; and vancomycin.     MEDICATIONS:          Current Outpatient Medications    Medication   Sig   Dispense   Refill    .   albuterol (PROVENTIL HFA;VENTOLIN HFA) 108 (90 Base) MCG/ACT inhaler   Inhale 2 puffs into the lungs every 4 (four) hours as needed for wheezing or  shortness of breath (wheezing, cough, shortness of breath).   1 Inhaler   0    .   bifidobacterium infantis (ALIGN) capsule   Take 1 capsule by mouth 2 (two) times daily.             .   chlorhexidine (HIBICLENS) 4 % external liquid   Uses every day            .   glimepiride (AMARYL) 2 MG tablet   Take 2-4 mg by mouth See admin instructions. Take 4 mg in the morning and 2 mg in the evening            .   glucose blood (ONE TOUCH ULTRA TEST) test strip   USE THREE TIMES DAILY            .   Insulin  Glargine (BASAGLAR KWIKPEN) 100 UNIT/ML SOPN   Inject 20-40 Units into the skin See admin instructions. Take 40 units  in the morning and 20 units in the evening       12    .   IRON-VITAMIN C PO   Take 1 tablet by mouth 2 (two) times daily.             Marland Kitchen   lactulose, encephalopathy, (CHRONULAC) 10 GM/15ML SOLN   Take 10 g by mouth daily.             Marland Kitchen   loratadine (CLARITIN) 10 MG tablet   Take 10 mg by mouth daily.            .   magnesium oxide (MAG-OX) 400 MG tablet   Take 400 mg by mouth 2 (two) times daily.             .   metolazone (ZAROXOLYN) 2.5 MG tablet   TAKE ONE TABLET BY MOUTH ONCE A WEEK            .   mupirocin cream (BACTROBAN) 2 %   Apply 1 application topically daily.            Glory Rosebush DELICA LANCETS 83J MISC   AS DIRECTED            .   polyethylene glycol (MIRALAX / GLYCOLAX) 17 g packet   Take 17 g by mouth daily.            .   potassium chloride SA (KLOR-CON M20) 20 MEQ tablet   TAKE 1 TABLET BY MOUTH FOUR TIMES A DAY            .   spironolactone (ALDACTONE) 25 MG tablet   Take 50 mg by mouth daily.            Marland Kitchen   torsemide (DEMADEX) 20 MG tablet   Take 30 mg by mouth daily.             Marland Kitchen   triamcinolone cream (KENALOG) 0.1 %   APPLY TO AFFECTED AREA TWICE A DAY            .   TURMERIC PO   Take 1,000 mg by mouth 2 (two) times daily.                No current facility-administered medications for this visit.              PHYSICAL EXAMINATION:  ECOG PERFORMANCE STATUS: 1 - Symptomatic but completely ambulatory      Vitals:  04/19/19 1112    BP:   122/68    Pulse:   76    Resp:   18    Temp:   97.6 F (36.4 C)           Filed Weights        04/19/19 1112    Weight:   244 lb 3.2 oz (110.8 kg)       Physical examination was performed while patient is sitting.  He has  difficulty getting onto the examination table due to hip pain.  Physical Exam  Constitutional:       General: He is not in acute distress. HENT:      Head: Normocephalic and atraumatic.  Eyes:      General: No scleral icterus.    Pupils: Pupils are equal, round, and reactive to light.  Neck:      Musculoskeletal: Normal range of motion and neck supple.  Cardiovascular:      Rate and Rhythm: Normal rate and regular rhythm.     Heart sounds: Normal heart sounds.  Pulmonary:      Effort: Pulmonary effort is normal. No respiratory distress.     Breath sounds: No wheezing.  Abdominal:     General: Bowel sounds are normal. There is no distension.     Palpations: Abdomen is soft. There is no mass.     Tenderness: There is no abdominal tenderness.   Musculoskeletal: Normal range of motion.         General: No deformity.     Left lower leg: Edema present.   Skin:     General: Skin is warm and dry.     Findings: No erythema or rash.  Neurological:      Mental Status: He is alert and oriented to person, place, and time.     Cranial Nerves: No cranial nerve deficit.     Coordination: Coordination normal.  Psychiatric:        Behavior: Behavior normal.        Thought Content: Thought content normal.               LABORATORY DATA:   I have reviewed the data as listed  Recent Labs                                                                                              Recent Labs (within last 365 days)  Iron/TIBC/Ferritin/ %Sat   Labs (Brief)              RADIOGRAPHIC  STUDIES:  I have personally reviewed the radiological images as listed and agreed with the findings in the report.     Imaging Results                 07/29/2009 CT Abdomen Pelvis w contrast.   Mild Splenomegaly.      ASSESSMENT & PLAN:    1.   Thrombocytopenia ([place])     2.   Cirrhosis of liver without ascites, unspecified hepatic cirrhosis type (HCC)     3.   Splenomegaly     4.   Stage 3a chronic kidney disease        # Thrombocytopenia. 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  Patient has adequate vitamin B12 and folate level.Normal LDH.  Flow cytometry showed no significant immunophenotypic abnormality.Negative HIV, Hepatitis panel, inappropriately normal immature platelet fraction- indicating underproduction,   Thrombocytopenia is chronic, most likely secondary to chronic liver disease/hypersplenism.  He is current platelet count is 55,000.  Discussed with patient and wife that typical platelet threshold for general major surgery (except neurosurgery/ocular surgery) is 50,000, and currently patient is slightly above the threshold.     Recommend patient to have a repeat CBC 2 weeks prior to procedure.   If platelet count is less than 50,000, can consider Thrombopoietin receptor agonist- Avatrombopag to be started 10-12 days prior to procedure and take for 5 days. Perioperative transfusion of platelet to raise count to be above 50,000.       Patient also had coagulopathy with INR 1.4, prolonged PTT.  Recommend caution in transfusing FFP [if INR> 1.8], due to the risk of volume overload which may increase portal pressure.  Recommend patient to reestablish care with [facility] gastroenterology clinic to further optimize cirrhosis condition, monitor variceal bleeding, image surveillance.  Ultrasound abdomen was independent reviewed by me and discussed  with patient and wife.     All questions were answered. The patient knows to call the clinic with any problems questions or concerns.     Cc  Rusty Aus, MD     Return of visit: As needed  We spent sufficient time to discuss many aspect of care, questions were answered to patient's satisfaction.  Total face to face encounter time for this patient visit was 25 min. >50% of the time was  spent in counseling and coordination of care.         Earlie Server, MD, PhD  04/20/2019             Electronically signed by Earlie Server, MD at 04/20/2019  9:56 PM             Office Visit on 04/20/2019               Detailed Report

## 2019-08-13 NOTE — Patient Instructions (Addendum)
Your procedure is scheduled on: 08-21-19 TUESDAY Report to Same Day Surgery 2nd floor medical mall [facility] Entrance-take elevator on left to 2nd floor.  Check in with surgery information desk.) To find out your arrival time please call [phone] between 1PM - 3PM on 08-20-19 MONDAY  Remember: Instructions that are not followed completely may result in serious medical risk, up to and including death, or upon the discretion of your surgeon and anesthesiologist your surgery may need to be rescheduled.    _x___ 1. Do not eat food after midnight the night before your procedure. NO GUM OR CANDY AFTER MIDNIGHT. You may drink WATER up to 2 hours before you are scheduled to arrive at the hospital for your procedure.  Do not drink WATER  within 2 hours of your scheduled arrival to the hospital.  Type 1 and type 2 diabetics should only drink water.   ____Ensure clear carbohydrate drink on the way to the hospital for bariatric patients  _X___GATORADE G2-FINISH DRINK 2 HOURS PRIOR TO [place]     __x__ 2. No Alcohol for 24 hours before or after surgery.   __x__3. No Smoking or e-cigarettes for 24 prior to surgery.  Do not use any chewable tobacco products for at least 6 hour prior to surgery   ____  4. Bring all medications with you on the day of surgery if instructed.    __x__ 5. Notify your doctor if there is any change in your medical condition     (cold, fever, infections).    x___6. On the morning of surgery brush your teeth with toothpaste and water.  You may rinse your mouth with mouth wash if you wish.  Do not swallow any toothpaste or mouthwash.   Do not wear jewelry, make-up, hairpins, clips or nail polish.  Do not wear lotions, powders, or perfumes.  Do not shave 48 hours prior to surgery. Men may shave face and neck.  Do not bring valuables to the hospital.    [facility] is not responsible for any belongings or valuables.               Contacts, dentures or  bridgework may not be worn into surgery.  Leave your suitcase in the car. After surgery it may be brought to your room.  For patients admitted to the hospital, discharge time is determined by your treatment team.  _  Patients discharged the day of surgery will not be allowed to drive home.  You will need someone to drive you home and stay with you the night of your procedure.    Please read over the following fact sheets that you were given:   [facility] Preparing for Surgery and MRSA Information/INCENTIVE SPIROMETER INSTRUCTIONS  _x___ TAKE THE FOLLOWING MEDICATION THE MORNING OF SURGERY WITH A SMALL SIP OF WATER. These include:  1. CLARITIN (LORATADINE)  2. MAGNESIUM OXIDE  3.  4.  5.  6.  ____Fleets enema or Magnesium Citrate as directed.   _x___ Use CHG Soap or sage wipes as directed on instruction sheet   _X___ BRING ALBUTEROL INHALER TO HOSPITAL DAY OF SURGERY  ____ Stop Metformin and Janumet 2 days prior to surgery.    _X___ Take 1/2 of usual insulin dose the night before surgery and none on the morning surgery-TAKE HALF OF YOUR BASAGLAR Monday NIGHT AND NO INSULIN AM OF SURGERY  ____ Follow recommendations from Cardiologist, Pulmonologist or PCP regarding stopping Aspirin, Coumadin, Plavix ,Eliquis, Effient, or Pradaxa,  and Pletal.  X____Stop Anti-inflammatories such as Advil, Aleve, Ibuprofen, Motrin, Naproxen, Naprosyn, Goodies powders or aspirin products NOW-OK to take Tylenol    _x___ Stop supplements until after surgery-STOP YOUR TURMERIC AND FOCUSED MIND SUPPLEMENT NOW-MAY RESUME AFTER SURGERY   ____ Bring C-Pap to the hospital.

## 2019-08-13 NOTE — Pre-Procedure Instructions (Signed)
Echo complete3/03/2020 [place] Component Name Value Ref Range  LV Ejection Fraction (%) 55   Aortic Valve Stenosis Grade moderate   Aortic Valve Regurgitation Grade trivial   Aortic Valve Max Velocity (m/s) 3.8 m/sec  Aortic Valve Stenosis Mean Gradient (mmHg) 31.6 mmHg  Mitral Valve Stenosis Grade none   Mitral Valve Regurgitation Grade trivial   Tricuspid Valve Regurgitation Grade mild   Tricuspid Valve Regurgitation Max Velocity (m/s) 2.6 m/sec  Right Ventricle Systolic Pressure (mmHg) 87.5 mmHg  LV End Diastolic Diameter (cm) 4.7 cm  LV End Systolic Diameter (cm) 2.9 cm  LV Septum Wall Thickness (cm) 1.2 cm  LV Posterior Wall Thickness (cm) 1.2 cm  Left Atrium Diameter (cm) 4.5 cm  Result Narrative           INTERNAL MEDICINE DEPARTMENT        [facility], [place]                  [id number]      [place] #: [id number]      [place], [place], [place] [address]   Date: 08/09/2019 10: 45 AM                                Adult  Male  Age: 64 yrs      ECHOCARDIOGRAM REPORT               Outpatient                                KC^^KCWI    STUDY:CHEST WALL        TAPE:0000: 00: 0: 00: 00 MD1: MILLER, MARK FREDERIC    ECHO:Yes  DOPPLER:Yes    FILE:0000-000-000    BP: 120/60 mmHg    COLOR:Yes  CONTRAST:No   MACHINE:Philips  RV BIOPSY:No     3D:No SOUND QLTY:Moderate      Height: 69 in   MEDIUM:None                       Weight: 232 lb                                BSA: 2.2 m2 _________________________________________________________________________________________        HISTORY: DOE         REASON: Assess, LV function       INDICATION: Cardiac  murmur [R01.1 (ICD-10-CM)] _________________________________________________________________________________________ ECHOCARDIOGRAPHIC MEASUREMENTS 2D DIMENSIONS AORTA         Values  Normal Range  MAIN PA     Values  Normal Range        Annulus: nm*     [2.3-2.9]     PA Main: nm*    [1.5-2.1]       Aorta Sin: nm*     [3.1-3.7]  RIGHT VENTRICLE      ST Junction: nm*     [2.6-3.2]     RV Base: 5.1 cm  [< 4.2]       Asc.Aorta: nm*     [2.6-3.4]     RV Mid: nm*    [<3.5] LEFT VENTRICLE  RV Length: nm*    [<8.6]         LVIDd: 4.7 cm    [4.2-5.9]  INFERIOR VENA CAVA         LVIDs: 2.9 cm            Max. IVC: nm*    [<=2.1]           FS: 39.1 %    [>25]      Min. IVC: nm*          SWT: 1.2 cm    [0.6-1.0]  ------------------          PWT: 1.2 cm    [0.6-1.0]  nm* - not measured LEFT ATRIUM        LA Diam: 4.5 cm    [3.0-4.0]      LA A4C Area: nm*     [<20]       LA Volume: nm*     [18-58] _________________________________________________________________________________________ ECHOCARDIOGRAPHIC DESCRIPTIONS AORTIC ROOT          Size: Normal       Dissection: INDETERM FOR DISSECTION AORTIC VALVE        Leaflets: Tricuspid          Morphology: MODERATELY THICKENED        Mobility: PARTIALLY MOBILE LEFT VENTRICLE          Size: Normal            Anterior: Normal      Contraction: Normal             Lateral: Normal       Closest EF: >55% (Estimated)        Septal: Normal       LV Masses: No Masses            Apical: Normal          LVH: MILD LVH           Inferior: Normal                           Posterior: Normal       Dias.FxClass: (Grade 2) relaxation abnormal, pseudonormal MITRAL VALVE        Leaflets: Normal            Mobility: Fully mobile       Morphology: THICKENED LEAFLET(S) LEFT ATRIUM          Size: MILDLY ENLARGED       LA Masses: No masses       IA Septum: Normal IAS MAIN PA          Size: Normal PULMONIC VALVE       Morphology: Normal            Mobility: Fully mobile RIGHT VENTRICLE       RV Masses: No Masses             Size: Normal       Free Wall: Normal           Contraction: Normal TRICUSPID VALVE        Leaflets: Normal            Mobility: Fully mobile       Morphology: Normal RIGHT ATRIUM          Size: Normal            RA Other: None        RA Mass: No masses PERICARDIUM  Fluid: No effusion INFERIOR VENACAVA          Size: Normal Normal respiratory collapse _________________________________________________________________________________________  DOPPLER ECHO and OTHER SPECIAL PROCEDURES         Aortic: TRIVIAL AR         MODERATE AS             377.3 cm/sec peak vel   57.0 mmHg peak grad             31.6 mmHg mean grad    1.4 cm^2 by DOPPLER         Mitral: TRIVIAL MR         No MS             MV Inflow E Vel = 112.0 cm/sec   MV Annulus E'Vel = 6.9 cm/sec             E/E'Ratio = 16.2       Tricuspid: MILD TR          No TS             256.5 cm/sec peak TR vel  29.3 mmHg peak RV pressure       Pulmonary: TRIVIAL PR         No PS _________________________________________________________________________________________ INTERPRETATION NORMAL LEFT VENTRICULAR SYSTOLIC FUNCTION NORMAL RIGHT VENTRICULAR SYSTOLIC FUNCTION MILD VALVULAR REGURGITATION (See  above) MODERATE VALVULAR STENOSIS (See above) _________________________________________________________________________________________ Electronically signed by   Rusty Aus, MD on 08/09/2019 10: 36 AM      Performed By: Scherrie November, RCS   Ordering Physician: Emily Filbert _________________________________________________________________________________________  Other Result Information  Interface, Text Results In - 08/09/2019 10:37 AM EST                   INTERNAL MEDICINE DEPARTMENT               [place], [place]                                    [id number]           [place] #: [id number]           [place], [place],  [place] [address]      Date: 08/09/2019 10: 56 AM                                                              Adult   Male   Age: 59 yrs           ECHOCARDIOGRAM REPORT                              Outpatient  KC^^KCWI      STUDY:CHEST WALL               TAPE:0000: 00: 0: 00: 00 MD1: MILLER, MARK FREDERIC       ECHO:Yes    DOPPLER:Yes       FILE:0000-000-000        BP: 120/60 mmHg      COLOR:Yes   CONTRAST:No     MACHINE:Philips  RV BIOPSY:No          3D:No  SOUND QLTY:Moderate            Height: 69 in     MEDIUM:None                                              Weight: 232 lb                                                              BSA: 2.2 m2 _________________________________________________________________________________________               HISTORY: DOE                REASON: Assess, LV function            INDICATION: Cardiac murmur [R01.1 (ICD-10-CM)] _________________________________________________________________________________________ ECHOCARDIOGRAPHIC MEASUREMENTS 2D DIMENSIONS AORTA                  Values   Normal Range   MAIN PA         Values    Normal Range               Annulus: nm*           [2.3-2.9]         PA Main: nm*       [1.5-2.1]             Aorta Sin: nm*          [3.1-3.7]    RIGHT VENTRICLE           ST Junction: nm*          [2.6-3.2]         RV Base: 5.1 cm    [< 4.2]             Asc.Aorta: nm*          [2.6-3.4]          RV Mid: nm*       [<3.5] LEFT VENTRICLE                                      RV Length: nm*       [<8.6]                 LVIDd: 4.7 cm       [4.2-5.9]    INFERIOR VENA CAVA                 LVIDs: 2.9 cm                        Max.  IVC: nm*       [<=2.1]                    FS: 39.1 %       [>25]            Min. IVC: nm*                   SWT: 1.2 cm       [0.6-1.0]    ------------------                   PWT: 1.2 cm       [0.6-1.0]    nm* - not measured LEFT ATRIUM               LA Diam: 4.5 cm       [3.0-4.0]           LA A4C Area: nm*          [<20]             LA Volume: nm*          [18-58] _________________________________________________________________________________________ ECHOCARDIOGRAPHIC DESCRIPTIONS AORTIC ROOT                  Size: Normal            Dissection: INDETERM FOR DISSECTION AORTIC VALVE              Leaflets: Tricuspid                   Morphology: MODERATELY THICKENED              Mobility: PARTIALLY MOBILE LEFT VENTRICLE                  Size: Normal                        Anterior: Normal           Contraction: Normal                         Lateral: Normal            Closest EF: >55% (Estimated)                Septal: Normal             LV Masses: No Masses                       Apical: Normal                   LVH: MILD LVH                      Inferior: Normal                                                     Posterior: Normal          Dias.FxClass: (Grade 2) relaxation abnormal, pseudonormal MITRAL VALVE              Leaflets: Normal                        Mobility: Fully mobile  Morphology: THICKENED LEAFLET(S) LEFT ATRIUM                  Size: MILDLY ENLARGED              LA Masses: No  masses             IA Septum: Normal IAS MAIN PA                  Size: Normal PULMONIC VALVE            Morphology: Normal                        Mobility: Fully mobile RIGHT VENTRICLE             RV Masses: No Masses                         Size: Normal             Free Wall: Normal                     Contraction: Normal TRICUSPID VALVE              Leaflets: Normal                        Mobility: Fully mobile            Morphology: Normal RIGHT ATRIUM                  Size: Normal                        RA Other: None               RA Mass: No masses PERICARDIUM                 Fluid: No effusion INFERIOR VENACAVA                  Size: Normal Normal respiratory collapse _________________________________________________________________________________________  DOPPLER ECHO and OTHER SPECIAL PROCEDURES                Aortic: TRIVIAL AR                 MODERATE AS                        377.3 cm/sec peak vel      57.0 mmHg peak grad                        31.6 mmHg mean grad        1.4 cm^2 by DOPPLER                Mitral: TRIVIAL MR                 No MS                        MV Inflow E Vel = 112.0 cm/sec      MV Annulus E'Vel = 6.9 cm/sec                        E/E'Ratio = 16.2  Tricuspid: MILD TR                    No TS                        256.5 cm/sec peak TR vel   29.3 mmHg peak RV pressure             Pulmonary: TRIVIAL PR                 No PS _________________________________________________________________________________________ INTERPRETATION NORMAL LEFT VENTRICULAR SYSTOLIC FUNCTION NORMAL RIGHT VENTRICULAR SYSTOLIC FUNCTION MILD VALVULAR REGURGITATION (See above) MODERATE VALVULAR STENOSIS (See above) _________________________________________________________________________________________ Electronically signed by      Rusty Aus, MD on 08/09/2019 10: 36 AM          Performed By: Scherrie November, RCS    Ordering Physician: Emily Filbert _________________________________________________________________________________________  Status Results Details   Encounter Summary

## 2019-08-13 NOTE — Pre-Procedure Instructions (Signed)
Progress Notes - documented in this encounter Table of Contents for Progress Notes  Yevonne Pax, MD - 08/09/2019 10:00 AM EST  Yevonne Pax, MD - 08/09/2019 10:00 AM EST    Yevonne Pax, MD - 08/09/2019 10:00 AM EST Formatting of this note might be different from the original.    Patient Profile:   Nathan Schultz is a 64 y.o. male Chief Complaint  Patient presents with  . 4 week follow up   PROBLEM LIST: Past Medical History:  Diagnosis Date  . Allergic state  . Anemia  . Arthritis  . Cellulitis and abscess of leg 10/02/2013  . DM (diabetes mellitus) (CMS-HCC) 10/02/2013  . Hepatic encephalopathy (CMS-HCC) 10/02/2013  . History of cataract  . 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 OSA on CPAP 01/25/2017  8/18 AHI 18, RDI 29  . Stage 3b chronic kidney disease 08/09/2019   Past Surgical History:  Procedure Laterality Date  . ARTHROSCOPIC ROTATOR CUFF REPAIR 2010  which according to the patient has come apart  . CHOLECYSTECTOMY 2011  . Neck surgery with anterior fusion at the C5-C6 level 2008  . TONSILLECTOMY childhood   ALLERGIES: Allergies  Allergen Reactions  . Clindamycin Other (See Comments)  Platelet allergy  . Doxycycline Other (See Comments)  Platelet allergy  . Triamterene-Hydrochlorothiazid Other (See Comments)  Low platelets  . Vancomycin Other (See Comments)  Platelet allergy   CURRENT MEDICATIONS: Current Outpatient Medications: albuterol (PROVENTIL HFA) 90 mcg/actuation inhaler, Inhale into the lungs, PRN Not Currently Taking ascorbic acid (VITAMIN C ORAL), Take by mouth 250-500 mg daily, Taking BD ULTRA-FINE SHORT PEN NEEDLE 31 gauge x 5/16" needle, USE AS DIRECTED, Taking bifidobacterium infantis (ALIGN) 4 mg capsule, Take 2 capsules by mouth once daily , Taking blood glucose diagnostic test strip, 1 strip by XX route once  daily Use as instructed. One touch, Taking chlorhexidine (HIBICLENS) 4 % external wash, Uses every day , Taking ciprofloxacin HCl (CIPRO) 500 MG tablet, Take by mouth, Taking cyanocobalamin, vitamin B-12, (VITAMIN B-12 SL), Place under the tongue 5000 under tongue twice a day, Taking ferrous sulfate 325 (65 FE) MG tablet, Take by mouth, Taking glimepiride (AMARYL) 2 MG tablet, 2 tablets in am and 1 at night, Taking insulin GLARGINE (BASAGLAR KWIKPEN U-100 INSULIN) pen injector (concentration 100 units/mL), 40 units in the morning and 15 units in the evening (Patient taking differently: 40 units in the morning and 15-20 units in the evening ), Taking KLOR-CON M20 20 mEq ER tablet, TAKE 1 TABLET BY MOUTH FOUR TIMES A DAY, Taking loratadine (CLARITIN) 10 mg tablet, Take 10 mg by mouth once daily, Taking magnesium oxide (MAG-OX) 400 mg (241.3 mg magnesium) tablet, Take 400 mg by mouth 2 (two) times daily , Taking metOLazone (ZAROXOLYN) 2.5 MG tablet, Take 1 tablet (2.5 mg total) by mouth twice a week, PRN Not Currently Taking mupirocin (BACTROBAN) 2 % cream, Apply topically, Taking NOVOFINE 30 30 gauge x 1/3" needle, AS DIRECTED, Taking polyethylene glycol (MIRALAX) powder, Take 17 g by mouth once daily Mix in 4-8ounces of fluid prior to taking., PRN Not Currently Taking spironolactone (ALDACTONE) 25 MG tablet, Take 1 tablet (25 mg total) by mouth once daily, Taking TORsemide (DEMADEX) 20 MG tablet, TAKE 1 TABLET BY MOUTH TWICE A DAY (Patient taking differently: 1 1/2 tabs daily ), Taking triamcinolone 0.1 % cream, APPLY TO AFFECTED AREA TWICE A DAY (Patient taking differently:  As needed ), PRN Not Currently Taking TURMERIC ORAL, Take by mouth 1000 mg one twice a day, Taking VITAMIN D3 ORAL, Take by mouth 2 tabs daily, Taking lactulose (ENULOSE) 10 gram/15 mL oral solution, Take 30 mLs by mouth once daily for 30 days (Patient taking differently: Take 30 mLs by mouth once daily Doesn't use regularly  )  HPI   CLINICAL SUMMARY:  Patient with hip surgery next week. No chest discomfort, no shortness of breath, overall medically very stable. Recently had a UTI, now post Cipro, asymptomatic, sugars doing incredibly well  ROS: Review of systems is unremarkable for any active cardiac, respiratory, GI, GU, hematologic, neurologic, dermatologic, HEENT, or psychiatric symptoms except as noted above, 10 systems reviewed. No fevers, chills, or constitutional symptoms.  PHYSICAL EXAM  Vital signs: BP 120/70  Pulse 70  Wt (!) 106.8 kg (235 lb 6.4 oz)  SpO2 98%  BMI 34.76 kg/m Body mass index is 34.76 kg/m.  Wt Readings from Last 3 Encounters:  08/09/19 (!) 106.8 kg (235 lb 6.4 oz)  07/12/19 (!) 105.2 kg (232 lb)  07/05/19 (!) 115.7 kg (255 lb)   BP Readings from Last 3 Encounters:  08/09/19 120/70  07/12/19 120/60  07/05/19 120/78   Constitutional:NAD Neck: supple, no thyromegaly, good ROM Respiratory:clear to auscultation, no rales or wheezes Cardiovascular:RRR, no murmur or gallop Abdominal:soft, good BS, NT Ext: no edema, good peripheral pulses Neuro: alert and oriented X 3, grossly nonfocal  ASSESSMENT/PLAN   Preop hip surgery-medically stable, platelets chronically in the 50,000 range with chronic liver disease, EKG shows normal sinus rhythm without ischemic changes  Moderate aortic stenosis-seen on echocardiogram, currently asymptomatic, will follow Liver cirrhosis-pro time stable at 1.3, does not use lactulose as his bowels move well and no sign of encephalopathy Platelets are stable Diabetes mellitus-A1c down to 7.4 from 9.0, doing fairly well CKD 3-creatinine stable 1.6, at baseline Cystitis-resolved post Cipro  Recent diabetic eye check normal  Dispo: Return in about 4 months (around 12/09/2019) for followup.    Electronically signed by Yevonne Pax, MD at 08/09/2019 12:39 PM EST  Back to top of Progress Notes Yevonne Pax, MD - 08/09/2019  10:00 AM EST Formatting of this note might be different from the original. Kaceton Vieau is a 64 y.o. here for Medicare Wellness Visit  MEDICARE WELLNESS VISIT  Providers Rendering Care 1. Dr. Emily Filbert (PCP)  Functional Assessment (1) Hearing: Demonstrates no difficulty in hearing during normal conversation (2) Risk of Falls: Patient denies any falls or near falls in the last year, Gait steady without assistance during walk from waiting area to exam room (3) Home Safety: Patient feels secure in their home, There are operational smoke alarms in multiple areas of the home (4) Activities of Daily Living: Independently manages personal grooming and household chores, including cooking, cleaning and laundry. Manages Personal finances without assistance.  Depression Screening PHQ 2/9 last 3 flowsheet values  PHQ-2/9 Depression Screening 08/09/2019  Over the last 2 weeks, have you experienced little pleasure or interest in doing things? 0  Feeling down, depressed, or hopeless (or irritable for Teens only)? 0  Total Prescreening Score 0  Total Score = 0   Depression Severity and Treatment Recommendations:  0-4= None  5-9= Mild / Treatment: Support, educate to call if worse; return in one month  10-14= Moderate / Treatment: Support, watchful waiting; Antidepressant or Psychotherapy  15-19= Moderately severe / Treatment: Antidepressant OR Psychotherapy  >= 20 = Major depression, severe / Antidepressant  AND Psychotherapy  Cognitive Impairment Patient denies episodes of losing things, being forgetful. Seems oriented to person, place and time. Responses appear appropriate and timely to this observer.  PREVENTION PLAN  Cardiovascular: Last LDL 75 Diabetes: Last A1c 7.4 Glaucoma: N/A Smoking Cessation: Not Applicable  Other Personalized Health Advice  Encouraged patient to routinely physically exercise, eat a well-balanced diet  End of Life Counseling No living will, no power of  attorney; Full Code  Outpatient Encounter Medications as of 08/09/2019  Medication Sig Dispense Refill  . albuterol (PROVENTIL HFA) 90 mcg/actuation inhaler Inhale into the lungs  . ascorbic acid (VITAMIN C ORAL) Take by mouth 250-500 mg daily  . BD ULTRA-FINE SHORT PEN NEEDLE 31 gauge x 5/16" needle USE AS DIRECTED 100 each 1  . bifidobacterium infantis (ALIGN) 4 mg capsule Take 2 capsules by mouth once daily  . blood glucose diagnostic test strip 1 strip by XX route once daily Use as instructed. One touch  . chlorhexidine (HIBICLENS) 4 % external wash Uses every day  . ciprofloxacin HCl (CIPRO) 500 MG tablet Take by mouth  . cyanocobalamin, vitamin B-12, (VITAMIN B-12 SL) Place under the tongue 5000 under tongue twice a day  . ferrous sulfate 325 (65 FE) MG tablet Take by mouth  . glimepiride (AMARYL) 2 MG tablet 2 tablets in am and 1 at night 270 tablet 3  . insulin GLARGINE (BASAGLAR KWIKPEN U-100 INSULIN) pen injector (concentration 100 units/mL) 40 units in the morning and 15 units in the evening (Patient taking differently: 40 units in the morning and 15-20 units in the evening ) 45 Syringe 4  . KLOR-CON M20 20 mEq ER tablet TAKE 1 TABLET BY MOUTH FOUR TIMES A DAY 360 tablet 3  . loratadine (CLARITIN) 10 mg tablet Take 10 mg by mouth once daily  . magnesium oxide (MAG-OX) 400 mg (241.3 mg magnesium) tablet Take 400 mg by mouth 2 (two) times daily 30 tablet 11  . metOLazone (ZAROXOLYN) 2.5 MG tablet Take 1 tablet (2.5 mg total) by mouth twice a week 30 tablet 11  . mupirocin (BACTROBAN) 2 % cream Apply topically  . NOVOFINE 30 30 gauge x 1/3" needle AS DIRECTED 100 each 1  . polyethylene glycol (MIRALAX) powder Take 17 g by mouth once daily Mix in 4-8ounces of fluid prior to taking.  Marland Kitchen spironolactone (ALDACTONE) 25 MG tablet Take 1 tablet (25 mg total) by mouth once daily  . TORsemide (DEMADEX) 20 MG tablet TAKE 1 TABLET BY MOUTH TWICE A DAY (Patient taking differently: 1 1/2 tabs daily  ) 180 tablet 1  . triamcinolone 0.1 % cream APPLY TO AFFECTED AREA TWICE A DAY (Patient taking differently: As needed ) 30 g 2  . TURMERIC ORAL Take by mouth 1000 mg one twice a day  . VITAMIN D3 ORAL Take by mouth 2 tabs daily  . lactulose (ENULOSE) 10 gram/15 mL oral solution Take 30 mLs by mouth once daily for 30 days (Patient taking differently: Take 30 mLs by mouth once daily Doesn't use regularly ) 900 mL 11  . [DISCONTINUED] ciprofloxacin HCl (CIPRO) 500 MG tablet Take 1 tablet (500 mg total) by mouth 2 (two) times daily for 7 days 14 tablet 0   No facility-administered encounter medications on file as of 08/09/2019.   Allergies as of 08/09/2019 - Reviewed 08/09/2019  Allergen Reaction Noted  . Clindamycin Other (See Comments) 12/18/2013  . Doxycycline Other (See Comments) 12/18/2013  . Triamterene-hydrochlorothiazid Other (See Comments) 12/18/2013  .  Vancomycin Other (See Comments) 12/18/2013   Past Medical History:  Diagnosis Date  . Allergic state  . Anemia  . Arthritis  . Cellulitis and abscess of leg 10/02/2013  . DM (diabetes mellitus) (CMS-HCC) 10/02/2013  . Hepatic encephalopathy (CMS-HCC) 10/02/2013  . History of cataract  . 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 OSA on CPAP 01/25/2017  8/18 AHI 18, RDI 29  . Stage 3b chronic kidney disease 08/09/2019   Past Surgical History:  Procedure Laterality Date  . ARTHROSCOPIC ROTATOR CUFF REPAIR 2010  which according to the patient has come apart  . CHOLECYSTECTOMY 2011  . Neck surgery with anterior fusion at the C5-C6 level 2008  . TONSILLECTOMY childhood   Health Maintenance  Topic Date Due  . Serum Phosphorus Never done  . Parathyroid Hormone Never done  . Diabetes Education Never done  . Medicare Initial or AWV Never done  . Hepatocellular Carcinoma Screening Never done  . Monofilament Foot Exam Never done  . Eye Exam  Never done  . HIV Screen Never done  . COVID-19 Vaccine (1 of 2) Never done  . Adult Tetanus (Td And Tdap) Never done  . Hepatitis B Vaccines (1 of 3 - Risk 3-dose series) Never done  . Shingrix (1 of 2) Never done  . Influenza Vaccine (1) 01/30/2019  . Pneumo Low Risk Adult (1 of 2 - PCV13) Never done  . Colorectal Cancer Screening 01/03/2020  . Hemoglobin A1C 02/02/2020  . Urine Microalbumin 05/22/2020  . Creatinine Level 08/01/2020  . Serum Bicarbonate 08/01/2020  . Serum Calcium 08/01/2020  . PSA 05/22/2021  . Lipid Panel 08/01/2024  . Hepatitis C Screen Addressed  . HPV Vaccines Aged Out   Vitals:  08/09/19 1015  BP: 120/70  Pulse: 70   Vision exam: No correction, bilateral 20/20, right 20/50, left 20/40  Assessment/Plan  1. Medicare wellness visit- Medications and allergies reviewed. Copy of preventative health provided. Labs reviewed and copy provided. Post Covid vaccines  MARK Roselee Culver, MD  Electronically signed by Yevonne Pax, MD at 08/09/2019 12:39 PM EST

## 2019-08-17 ENCOUNTER — Encounter
Admission: RE | Admit: 2019-08-17 | Discharge: 2019-08-17 | Disposition: A | Discharge status: Home / Self Care | Payer: Medicare HMO | Source: Ambulatory Visit | Attending: Orthopedic Surgery | Admitting: Orthopedic Surgery

## 2019-08-17 ENCOUNTER — Other Ambulatory Visit: Payer: Medicare HMO

## 2019-08-17 ENCOUNTER — Other Ambulatory Visit: Payer: Self-pay

## 2019-08-17 DIAGNOSIS — Z01812 Encounter for preprocedural laboratory examination: Secondary | ICD-10-CM | POA: Diagnosis present

## 2019-08-17 DIAGNOSIS — Z20822 Contact with and (suspected) exposure to covid-19: Secondary | ICD-10-CM | POA: Diagnosis not present

## 2019-08-17 LAB — URINALYSIS, ROUTINE W REFLEX MICROSCOPIC
Bacteria, UA: NONE SEEN
Bilirubin Urine: NEGATIVE
Glucose, UA: 150 mg/dL — AB
Hgb urine dipstick: NEGATIVE
Ketones, ur: NEGATIVE mg/dL
Nitrite: NEGATIVE
Protein, ur: NEGATIVE mg/dL
Specific Gravity, Urine: 1.018 (ref 1.005–1.030)
WBC, UA: >50 WBC/hpf — ABNORMAL HIGH (ref 0–5)
pH: 6 (ref 5.0–8.0)

## 2019-08-17 LAB — SARS CORONAVIRUS 2 (TAT 6-24 HRS): SARS Coronavirus 2: NEGATIVE

## 2019-08-17 LAB — SURGICAL PCR SCREEN
MRSA, PCR: NEGATIVE
Staphylococcus aureus: NEGATIVE

## 2019-08-17 LAB — PLATELET COUNT: Platelets: 42 10*3/uL — ABNORMAL LOW (ref 150–400)

## 2019-08-17 NOTE — Pre-Procedure Instructions (Signed)
Secure chat msg to Dr Rudene Christians ie UA and platelets. Plans to transfuse DOS per Dr Rudene Christians.

## 2019-08-21 ENCOUNTER — Encounter
Admission: RE | Disposition: A | Discharge status: Home Health | Payer: Self-pay | Source: Home / Self Care | Attending: Orthopedic Surgery

## 2019-08-21 ENCOUNTER — Inpatient Hospital Stay
Admission: RE | Admit: 2019-08-21 | Discharge: 2019-08-27 | DRG: 470 | Disposition: A | Discharge status: Home Health | Payer: Medicare HMO | Attending: Orthopedic Surgery | Admitting: Orthopedic Surgery

## 2019-08-21 ENCOUNTER — Inpatient Hospital Stay: Payer: Medicare HMO | Admitting: Certified Registered Nurse Anesthetist

## 2019-08-21 ENCOUNTER — Inpatient Hospital Stay: Payer: Medicare HMO

## 2019-08-21 ENCOUNTER — Other Ambulatory Visit: Payer: Self-pay

## 2019-08-21 ENCOUNTER — Encounter: Payer: Self-pay | Admitting: Orthopedic Surgery

## 2019-08-21 DIAGNOSIS — Z8042 Family history of malignant neoplasm of prostate: Secondary | ICD-10-CM | POA: Diagnosis not present

## 2019-08-21 DIAGNOSIS — K7290 Hepatic failure, unspecified without coma: Secondary | ICD-10-CM | POA: Diagnosis not present

## 2019-08-21 DIAGNOSIS — Z8249 Family history of ischemic heart disease and other diseases of the circulatory system: Secondary | ICD-10-CM

## 2019-08-21 DIAGNOSIS — Z833 Family history of diabetes mellitus: Secondary | ICD-10-CM

## 2019-08-21 DIAGNOSIS — Z85828 Personal history of other malignant neoplasm of skin: Secondary | ICD-10-CM

## 2019-08-21 DIAGNOSIS — Z794 Long term (current) use of insulin: Secondary | ICD-10-CM

## 2019-08-21 DIAGNOSIS — Z820 Family history of epilepsy and other diseases of the nervous system: Secondary | ICD-10-CM

## 2019-08-21 DIAGNOSIS — Z881 Allergy status to other antibiotic agents status: Secondary | ICD-10-CM

## 2019-08-21 DIAGNOSIS — Z8614 Personal history of Methicillin resistant Staphylococcus aureus infection: Secondary | ICD-10-CM | POA: Diagnosis not present

## 2019-08-21 DIAGNOSIS — Z981 Arthrodesis status: Secondary | ICD-10-CM

## 2019-08-21 DIAGNOSIS — Z79899 Other long term (current) drug therapy: Secondary | ICD-10-CM

## 2019-08-21 DIAGNOSIS — Z9049 Acquired absence of other specified parts of digestive tract: Secondary | ICD-10-CM

## 2019-08-21 DIAGNOSIS — Z23 Encounter for immunization: Secondary | ICD-10-CM | POA: Diagnosis not present

## 2019-08-21 DIAGNOSIS — Z96642 Presence of left artificial hip joint: Secondary | ICD-10-CM

## 2019-08-21 DIAGNOSIS — Z83511 Family history of glaucoma: Secondary | ICD-10-CM

## 2019-08-21 DIAGNOSIS — D696 Thrombocytopenia, unspecified: Secondary | ICD-10-CM | POA: Diagnosis present

## 2019-08-21 DIAGNOSIS — E1122 Type 2 diabetes mellitus with diabetic chronic kidney disease: Secondary | ICD-10-CM | POA: Diagnosis present

## 2019-08-21 DIAGNOSIS — Z888 Allergy status to other drugs, medicaments and biological substances status: Secondary | ICD-10-CM | POA: Diagnosis not present

## 2019-08-21 DIAGNOSIS — R519 Headache, unspecified: Secondary | ICD-10-CM | POA: Diagnosis present

## 2019-08-21 DIAGNOSIS — G8918 Other acute postprocedural pain: Secondary | ICD-10-CM

## 2019-08-21 DIAGNOSIS — G4733 Obstructive sleep apnea (adult) (pediatric): Secondary | ICD-10-CM | POA: Diagnosis present

## 2019-08-21 DIAGNOSIS — Z8261 Family history of arthritis: Secondary | ICD-10-CM | POA: Diagnosis not present

## 2019-08-21 DIAGNOSIS — K7581 Nonalcoholic steatohepatitis (NASH): Secondary | ICD-10-CM | POA: Diagnosis present

## 2019-08-21 DIAGNOSIS — Z8349 Family history of other endocrine, nutritional and metabolic diseases: Secondary | ICD-10-CM

## 2019-08-21 DIAGNOSIS — M1612 Unilateral primary osteoarthritis, left hip: Principal | ICD-10-CM | POA: Diagnosis present

## 2019-08-21 DIAGNOSIS — D62 Acute posthemorrhagic anemia: Secondary | ICD-10-CM | POA: Diagnosis not present

## 2019-08-21 DIAGNOSIS — R110 Nausea: Secondary | ICD-10-CM | POA: Diagnosis not present

## 2019-08-21 DIAGNOSIS — N1832 Chronic kidney disease, stage 3b: Secondary | ICD-10-CM | POA: Diagnosis present

## 2019-08-21 DIAGNOSIS — K7469 Other cirrhosis of liver: Secondary | ICD-10-CM | POA: Diagnosis present

## 2019-08-21 DIAGNOSIS — Z20822 Contact with and (suspected) exposure to covid-19: Secondary | ICD-10-CM | POA: Diagnosis present

## 2019-08-21 DIAGNOSIS — Z419 Encounter for procedure for purposes other than remedying health state, unspecified: Secondary | ICD-10-CM

## 2019-08-21 HISTORY — PX: TOTAL HIP ARTHROPLASTY: SHX124

## 2019-08-21 LAB — CBC
HCT: 29 % — ABNORMAL LOW (ref 39.0–52.0)
Hemoglobin: 10.4 g/dL — ABNORMAL LOW (ref 13.0–17.0)
MCH: 34.8 pg — ABNORMAL HIGH (ref 26.0–34.0)
MCHC: 35.9 g/dL (ref 30.0–36.0)
MCV: 97 fL (ref 80.0–100.0)
Platelets: 114 10*3/uL — ABNORMAL LOW (ref 150–400)
RBC: 2.99 MIL/uL — ABNORMAL LOW (ref 4.22–5.81)
RDW: 13.9 % (ref 11.5–15.5)
WBC: 12.8 10*3/uL — ABNORMAL HIGH (ref 4.0–10.5)
nRBC: 0 % (ref 0.0–0.2)

## 2019-08-21 LAB — GLUCOSE, CAPILLARY
Glucose-Capillary: 99 mg/dL (ref 70–99)
Glucose-Capillary: 127 mg/dL — ABNORMAL HIGH (ref 70–99)
Glucose-Capillary: 160 mg/dL — ABNORMAL HIGH (ref 70–99)
Glucose-Capillary: 194 mg/dL — ABNORMAL HIGH (ref 70–99)
Glucose-Capillary: 378 mg/dL — ABNORMAL HIGH (ref 70–99)

## 2019-08-21 LAB — HEMOGLOBIN A1C
Hgb A1c MFr Bld: 7.6 % — ABNORMAL HIGH (ref 4.8–5.6)
Mean Plasma Glucose: 171.42 mg/dL

## 2019-08-21 LAB — PREPARE RBC (CROSSMATCH)

## 2019-08-21 LAB — HEMOGLOBIN AND HEMATOCRIT, BLOOD
HCT: 32 % — ABNORMAL LOW (ref 39.0–52.0)
Hemoglobin: 11.7 g/dL — ABNORMAL LOW (ref 13.0–17.0)

## 2019-08-21 SURGERY — ARTHROPLASTY, HIP, TOTAL, ANTERIOR APPROACH
Anesthesia: General | Site: Hip | Laterality: Left

## 2019-08-21 MED ORDER — TORSEMIDE 20 MG PO TABS
20.0000 mg | ORAL_TABLET | Freq: Two times a day (BID) | ORAL | Status: DC
  Administered 2019-08-22 – 2019-08-27 (×10): 20 mg via ORAL
  Filled 2019-08-21 (×11): qty 1

## 2019-08-21 MED ORDER — ONDANSETRON HCL 4 MG/2ML IJ SOLN
4.0000 mg | Freq: Once | INTRAMUSCULAR | Status: AC | PRN
  Administered 2019-08-21: 4 mg via INTRAVENOUS

## 2019-08-21 MED ORDER — METHOCARBAMOL 500 MG PO TABS
500.0000 mg | ORAL_TABLET | Freq: Four times a day (QID) | ORAL | Status: DC | PRN
  Administered 2019-08-24: 22:00:00 500 mg via ORAL
  Filled 2019-08-21: qty 1

## 2019-08-21 MED ORDER — METOCLOPRAMIDE HCL 10 MG PO TABS: 5.0000 mg | ORAL_TABLET | Freq: Three times a day (TID) | ORAL | Status: DC | PRN

## 2019-08-21 MED ORDER — MAGNESIUM OXIDE 400 (241.3 MG) MG PO TABS
400.0000 mg | ORAL_TABLET | Freq: Two times a day (BID) | ORAL | Status: DC
  Administered 2019-08-21 – 2019-08-27 (×12): 400 mg via ORAL
  Filled 2019-08-21 (×12): qty 1

## 2019-08-21 MED ORDER — INSULIN GLARGINE 100 UNIT/ML ~~LOC~~ SOLN
40.0000 [IU] | Freq: Every day | SUBCUTANEOUS | Status: DC
  Administered 2019-08-22 – 2019-08-27 (×6): 40 [IU] via SUBCUTANEOUS
  Filled 2019-08-21 (×6): qty 0.4

## 2019-08-21 MED ORDER — PHENOL 1.4 % MT LIQD
1.0000 | OROMUCOSAL | Status: DC | PRN
  Filled 2019-08-21: qty 177

## 2019-08-21 MED ORDER — ROCURONIUM BROMIDE 100 MG/10ML IV SOLN
INTRAVENOUS | Status: DC | PRN
  Administered 2019-08-21: 50 mg via INTRAVENOUS
  Administered 2019-08-21: 20 mg via INTRAVENOUS
  Administered 2019-08-21: 10 mg via INTRAVENOUS

## 2019-08-21 MED ORDER — INSULIN ASPART 100 UNIT/ML ~~LOC~~ SOLN
0.0000 [IU] | Freq: Three times a day (TID) | SUBCUTANEOUS | Status: DC
  Administered 2019-08-22 (×2): 15 [IU] via SUBCUTANEOUS
  Administered 2019-08-22: 08:00:00 11 [IU] via SUBCUTANEOUS
  Administered 2019-08-23: 8 [IU] via SUBCUTANEOUS
  Administered 2019-08-23: 18:00:00 11 [IU] via SUBCUTANEOUS
  Administered 2019-08-23 – 2019-08-24 (×2): 8 [IU] via SUBCUTANEOUS
  Administered 2019-08-24: 08:00:00 3 [IU] via SUBCUTANEOUS
  Administered 2019-08-24: 13:00:00 8 [IU] via SUBCUTANEOUS
  Administered 2019-08-25: 17:00:00 5 [IU] via SUBCUTANEOUS
  Administered 2019-08-25: 3 [IU] via SUBCUTANEOUS
  Administered 2019-08-26: 17:00:00 5 [IU] via SUBCUTANEOUS
  Administered 2019-08-26: 09:00:00 3 [IU] via SUBCUTANEOUS
  Administered 2019-08-26: 5 [IU] via SUBCUTANEOUS
  Administered 2019-08-27: 3 [IU] via SUBCUTANEOUS
  Filled 2019-08-21 (×14): qty 1

## 2019-08-21 MED ORDER — FAMOTIDINE 20 MG PO TABS
20.0000 mg | ORAL_TABLET | Freq: Once | ORAL | Status: AC
  Administered 2019-08-21: 20 mg via ORAL

## 2019-08-21 MED ORDER — ALBUTEROL SULFATE (2.5 MG/3ML) 0.083% IN NEBU: 2.5000 mg | INHALATION_SOLUTION | RESPIRATORY_TRACT | Status: DC | PRN

## 2019-08-21 MED ORDER — ACETAMINOPHEN 325 MG PO TABS: 325.0000 mg | ORAL_TABLET | Freq: Four times a day (QID) | ORAL | Status: DC | PRN

## 2019-08-21 MED ORDER — ALIGN PO CAPS: 1.0000 | ORAL_CAPSULE | Freq: Two times a day (BID) | ORAL | Status: DC

## 2019-08-21 MED ORDER — KETAMINE HCL 10 MG/ML IJ SOLN
INTRAMUSCULAR | Status: DC | PRN
  Administered 2019-08-21: 20 mg via INTRAVENOUS

## 2019-08-21 MED ORDER — VITAMIN B-12 1000 MCG PO TABS
1000.0000 ug | ORAL_TABLET | Freq: Every day | ORAL | Status: DC
  Administered 2019-08-22 – 2019-08-27 (×6): 1000 ug via ORAL
  Filled 2019-08-21 (×6): qty 1

## 2019-08-21 MED ORDER — SODIUM CHLORIDE 0.9 % IV BOLUS
200.0000 mL | Freq: Once | INTRAVENOUS | Status: AC
  Administered 2019-08-21: 200 mL via INTRAVENOUS

## 2019-08-21 MED ORDER — ONDANSETRON HCL 4 MG PO TABS: 4.0000 mg | ORAL_TABLET | Freq: Four times a day (QID) | ORAL | Status: DC | PRN

## 2019-08-21 MED ORDER — SODIUM CHLORIDE 0.9% IV SOLUTION: Freq: Once | INTRAVENOUS | Status: AC

## 2019-08-21 MED ORDER — DIPHENHYDRAMINE HCL 12.5 MG/5ML PO ELIX: 12.5000 mg | ORAL_SOLUTION | ORAL | Status: DC | PRN

## 2019-08-21 MED ORDER — PROPOFOL 10 MG/ML IV BOLUS
INTRAVENOUS | Status: DC | PRN
  Administered 2019-08-21: 130 mg via INTRAVENOUS

## 2019-08-21 MED ORDER — INSULIN GLARGINE 100 UNIT/ML ~~LOC~~ SOLN
15.0000 [IU] | Freq: Every day | SUBCUTANEOUS | Status: DC
  Administered 2019-08-21 – 2019-08-26 (×6): 15 [IU] via SUBCUTANEOUS
  Filled 2019-08-21 (×7): qty 0.15

## 2019-08-21 MED ORDER — GLIMEPIRIDE 2 MG PO TABS: 2.0000 mg | ORAL_TABLET | Freq: Every day | ORAL | Status: DC

## 2019-08-21 MED ORDER — CHLORHEXIDINE GLUCONATE 4 % EX LIQD: 60.0000 mL | Freq: Once | CUTANEOUS | Status: DC

## 2019-08-21 MED ORDER — LACTULOSE ENCEPHALOPATHY 10 GM/15ML PO SOLN
20.0000 g | Freq: Every day | ORAL | Status: DC | PRN
  Administered 2019-08-22: 20 g via ORAL
  Filled 2019-08-21 (×3): qty 30

## 2019-08-21 MED ORDER — LACTATED RINGERS IV SOLN: INTRAVENOUS | Status: DC

## 2019-08-21 MED ORDER — HEMOSTATIC AGENTS (NO CHARGE) OPTIME
TOPICAL | Status: DC | PRN
  Administered 2019-08-21: 1 via TOPICAL

## 2019-08-21 MED ORDER — SODIUM CHLORIDE 0.9 % IV SOLN: INTRAVENOUS | Status: DC | PRN

## 2019-08-21 MED ORDER — ALBUMIN HUMAN 25 % IV SOLN: INTRAVENOUS | Status: DC | PRN

## 2019-08-21 MED ORDER — SUGAMMADEX SODIUM 500 MG/5ML IV SOLN
INTRAVENOUS | Status: DC | PRN
  Administered 2019-08-21: 200 mg via INTRAVENOUS

## 2019-08-21 MED ORDER — DOCUSATE SODIUM 100 MG PO CAPS
100.0000 mg | ORAL_CAPSULE | Freq: Two times a day (BID) | ORAL | Status: DC
  Administered 2019-08-21 – 2019-08-27 (×11): 100 mg via ORAL
  Filled 2019-08-21 (×11): qty 1

## 2019-08-21 MED ORDER — CEFAZOLIN SODIUM-DEXTROSE 2-4 GM/100ML-% IV SOLN
2.0000 g | Freq: Four times a day (QID) | INTRAVENOUS | Status: AC
  Administered 2019-08-21 – 2019-08-22 (×2): 2 g via INTRAVENOUS
  Filled 2019-08-21 (×3): qty 100

## 2019-08-21 MED ORDER — ONDANSETRON HCL 4 MG/2ML IJ SOLN
4.0000 mg | Freq: Four times a day (QID) | INTRAMUSCULAR | Status: DC | PRN
  Administered 2019-08-22 – 2019-08-26 (×2): 4 mg via INTRAVENOUS
  Filled 2019-08-21 (×2): qty 2

## 2019-08-21 MED ORDER — ASCORBIC ACID 500 MG PO TABS
500.0000 mg | ORAL_TABLET | Freq: Every day | ORAL | Status: DC
  Administered 2019-08-22 – 2019-08-27 (×6): 500 mg via ORAL
  Filled 2019-08-21 (×6): qty 1

## 2019-08-21 MED ORDER — ONDANSETRON HCL 4 MG/2ML IJ SOLN
INTRAMUSCULAR | Status: DC | PRN
  Administered 2019-08-21: 4 mg via INTRAVENOUS

## 2019-08-21 MED ORDER — FENTANYL CITRATE (PF) 100 MCG/2ML IJ SOLN: 25.0000 ug | INTRAMUSCULAR | Status: DC | PRN

## 2019-08-21 MED ORDER — CEFAZOLIN SODIUM-DEXTROSE 2-4 GM/100ML-% IV SOLN
2.0000 g | INTRAVENOUS | Status: AC
  Administered 2019-08-21: 2 g via INTRAVENOUS

## 2019-08-21 MED ORDER — ALUM & MAG HYDROXIDE-SIMETH 200-200-20 MG/5ML PO SUSP: 30.0000 mL | ORAL | Status: DC | PRN

## 2019-08-21 MED ORDER — GLIMEPIRIDE 2 MG PO TABS
2.0000 mg | ORAL_TABLET | Freq: Every day | ORAL | Status: DC
  Administered 2019-08-22 – 2019-08-26 (×5): 2 mg via ORAL
  Filled 2019-08-21 (×6): qty 1

## 2019-08-21 MED ORDER — MAGNESIUM CITRATE PO SOLN
1.0000 | Freq: Once | ORAL | Status: DC | PRN
  Filled 2019-08-21: qty 296

## 2019-08-21 MED ORDER — MORPHINE SULFATE (PF) 2 MG/ML IV SOLN: 0.5000 mg | INTRAVENOUS | Status: DC | PRN

## 2019-08-21 MED ORDER — FENTANYL CITRATE (PF) 100 MCG/2ML IJ SOLN
INTRAMUSCULAR | Status: DC | PRN
  Administered 2019-08-21: 25 ug via INTRAVENOUS
  Administered 2019-08-21 (×2): 50 ug via INTRAVENOUS

## 2019-08-21 MED ORDER — SODIUM CHLORIDE 0.9 % IV SOLN: INTRAVENOUS | Status: DC

## 2019-08-21 MED ORDER — INSULIN GLARGINE 100 UNIT/ML ~~LOC~~ SOLN: 15.0000 [IU] | Freq: Every day | SUBCUTANEOUS | Status: DC

## 2019-08-21 MED ORDER — TRAMADOL HCL 50 MG PO TABS
50.0000 mg | ORAL_TABLET | Freq: Four times a day (QID) | ORAL | Status: DC
  Administered 2019-08-21 – 2019-08-23 (×7): 50 mg via ORAL
  Filled 2019-08-21 (×7): qty 1

## 2019-08-21 MED ORDER — NEOMYCIN-POLYMYXIN B GU 40-200000 IR SOLN
Status: DC | PRN
  Administered 2019-08-21: 4 mL

## 2019-08-21 MED ORDER — BISACODYL 10 MG RE SUPP: 10.0000 mg | Freq: Every day | RECTAL | Status: DC | PRN

## 2019-08-21 MED ORDER — PANTOPRAZOLE SODIUM 40 MG PO TBEC
40.0000 mg | DELAYED_RELEASE_TABLET | Freq: Every day | ORAL | Status: DC
  Administered 2019-08-23 – 2019-08-27 (×5): 40 mg via ORAL
  Filled 2019-08-21 (×6): qty 1

## 2019-08-21 MED ORDER — SPIRONOLACTONE 25 MG PO TABS
25.0000 mg | ORAL_TABLET | Freq: Every day | ORAL | Status: DC
  Administered 2019-08-22 – 2019-08-27 (×6): 25 mg via ORAL
  Filled 2019-08-21 (×6): qty 1

## 2019-08-21 MED ORDER — PHENYLEPHRINE HCL (PRESSORS) 10 MG/ML IV SOLN
INTRAVENOUS | Status: DC | PRN
  Administered 2019-08-21: 100 ug via INTRAVENOUS
  Administered 2019-08-21: 200 ug via INTRAVENOUS
  Administered 2019-08-21: 100 ug via INTRAVENOUS
  Administered 2019-08-21: 200 ug via INTRAVENOUS
  Administered 2019-08-21 (×9): 100 ug via INTRAVENOUS

## 2019-08-21 MED ORDER — VITAMIN D3 25 MCG (1000 UNIT) PO TABS
4000.0000 [IU] | ORAL_TABLET | Freq: Every day | ORAL | Status: DC
  Administered 2019-08-22 – 2019-08-27 (×6): 4000 [IU] via ORAL
  Filled 2019-08-21 (×11): qty 4

## 2019-08-21 MED ORDER — LIDOCAINE HCL (CARDIAC) PF 100 MG/5ML IV SOSY
PREFILLED_SYRINGE | INTRAVENOUS | Status: DC | PRN
  Administered 2019-08-21: 100 mg via INTRAVENOUS

## 2019-08-21 MED ORDER — HYDROCODONE-ACETAMINOPHEN 7.5-325 MG PO TABS
1.0000 | ORAL_TABLET | ORAL | Status: DC | PRN
  Administered 2019-08-22: 1 via ORAL
  Filled 2019-08-21 (×2): qty 1

## 2019-08-21 MED ORDER — DEXAMETHASONE SODIUM PHOSPHATE 10 MG/ML IJ SOLN
INTRAMUSCULAR | Status: DC | PRN
  Administered 2019-08-21: 5 mg via INTRAVENOUS

## 2019-08-21 MED ORDER — METHOCARBAMOL 1000 MG/10ML IJ SOLN
500.0000 mg | Freq: Four times a day (QID) | INTRAVENOUS | Status: DC | PRN
  Filled 2019-08-21: qty 5

## 2019-08-21 MED ORDER — ONDANSETRON HCL 4 MG/2ML IJ SOLN
INTRAMUSCULAR | Status: AC
  Filled 2019-08-21: qty 2

## 2019-08-21 MED ORDER — MIDAZOLAM HCL 2 MG/2ML IJ SOLN
INTRAMUSCULAR | Status: DC | PRN
  Administered 2019-08-21: 2 mg via INTRAVENOUS

## 2019-08-21 MED ORDER — FERROUS SULFATE 325 (65 FE) MG PO TABS
325.0000 mg | ORAL_TABLET | Freq: Two times a day (BID) | ORAL | Status: DC
  Administered 2019-08-22 – 2019-08-27 (×11): 325 mg via ORAL
  Filled 2019-08-21 (×11): qty 1

## 2019-08-21 MED ORDER — BUPIVACAINE-EPINEPHRINE 0.25% -1:200000 IJ SOLN
INTRAMUSCULAR | Status: DC | PRN
  Administered 2019-08-21: 30 mL

## 2019-08-21 MED ORDER — FLUMAZENIL 0.5 MG/5ML IV SOLN
0.2000 mg | Freq: Once | INTRAVENOUS | Status: AC
  Administered 2019-08-21: 0.2 mg via INTRAVENOUS

## 2019-08-21 MED ORDER — METOCLOPRAMIDE HCL 5 MG/ML IJ SOLN: 5.0000 mg | Freq: Three times a day (TID) | INTRAMUSCULAR | Status: DC | PRN

## 2019-08-21 MED ORDER — ZOLPIDEM TARTRATE 5 MG PO TABS: 5.0000 mg | ORAL_TABLET | Freq: Every evening | ORAL | Status: DC | PRN

## 2019-08-21 MED ORDER — GLIMEPIRIDE 4 MG PO TABS
4.0000 mg | ORAL_TABLET | Freq: Every day | ORAL | Status: DC
  Administered 2019-08-22 – 2019-08-27 (×6): 4 mg via ORAL
  Filled 2019-08-21 (×6): qty 1

## 2019-08-21 MED ORDER — HYDROCODONE-ACETAMINOPHEN 5-325 MG PO TABS: 1.0000 | ORAL_TABLET | ORAL | Status: DC | PRN

## 2019-08-21 MED ORDER — METOLAZONE 2.5 MG PO TABS
2.5000 mg | ORAL_TABLET | Freq: Every day | ORAL | Status: DC | PRN
  Filled 2019-08-21: qty 1

## 2019-08-21 MED ORDER — MAGNESIUM HYDROXIDE 400 MG/5ML PO SUSP
30.0000 mL | Freq: Every day | ORAL | Status: DC | PRN
  Administered 2019-08-22: 30 mL via ORAL
  Filled 2019-08-21: qty 30

## 2019-08-21 MED ORDER — PNEUMOCOCCAL VAC POLYVALENT 25 MCG/0.5ML IJ INJ
0.5000 mL | INJECTION | INTRAMUSCULAR | Status: DC
  Filled 2019-08-21 (×4): qty 0.5

## 2019-08-21 MED ORDER — LORATADINE 10 MG PO TABS
10.0000 mg | ORAL_TABLET | ORAL | Status: DC
  Administered 2019-08-22 – 2019-08-27 (×6): 10 mg via ORAL
  Filled 2019-08-21 (×6): qty 1

## 2019-08-21 MED ORDER — MENTHOL 3 MG MT LOZG
1.0000 | LOZENGE | OROMUCOSAL | Status: DC | PRN
  Filled 2019-08-21: qty 9

## 2019-08-21 MED ORDER — POTASSIUM CHLORIDE CRYS ER 20 MEQ PO TBCR
20.0000 meq | EXTENDED_RELEASE_TABLET | Freq: Two times a day (BID) | ORAL | Status: DC
  Administered 2019-08-22 – 2019-08-27 (×11): 20 meq via ORAL
  Filled 2019-08-21 (×12): qty 1

## 2019-08-21 MED ORDER — INFLUENZA VAC A&B SA ADJ QUAD 0.5 ML IM PRSY
0.5000 mL | PREFILLED_SYRINGE | INTRAMUSCULAR | Status: DC
  Filled 2019-08-21: qty 0.5

## 2019-08-21 MED ORDER — SODIUM CHLORIDE 0.9 % IV SOLN
INTRAVENOUS | Status: DC | PRN
  Administered 2019-08-21: 60 mL

## 2019-08-21 SURGICAL SUPPLY — 72 items
APL PRP STRL LF DISP 70% ISPRP (MISCELLANEOUS) ×1
BLADE SAGITTAL AGGR TOOTH XLG (BLADE) ×3 IMPLANT
BNDG COHESIVE 6X5 TAN STRL LF (GAUZE/BANDAGES/DRESSINGS) ×9 IMPLANT
BOWL CEMENT MIXING ADV NOZZLE (MISCELLANEOUS) ×4 IMPLANT
CANISTER SUCT 1200ML W/VALVE (MISCELLANEOUS) ×3 IMPLANT
CANISTER WOUND CARE 500ML ATS (WOUND CARE) ×3 IMPLANT
CEMENT BONE 1-PACK (Cement) ×8 IMPLANT
CEMENT RESTRICTOR DEPUY SZ 4 (Cement) ×2 IMPLANT
CHLORAPREP W/TINT 26 (MISCELLANEOUS) ×3 IMPLANT
COVER BACK TABLE REUSABLE LG (DRAPES) ×3 IMPLANT
COVER WAND RF STERILE (DRAPES) ×3 IMPLANT
DRAPE 3/4 80X56 (DRAPES) ×9 IMPLANT
DRAPE C-ARM XRAY 36X54 (DRAPES) ×3 IMPLANT
DRAPE INCISE IOBAN 66X60 STRL (DRAPES) ×2 IMPLANT
DRAPE POUCH INSTRU U-SHP 10X18 (DRAPES) ×3 IMPLANT
DRESSING SURGICEL FIBRLLR 1X2 (HEMOSTASIS) ×2 IMPLANT
DRSG OPSITE POSTOP 4X8 (GAUZE/BANDAGES/DRESSINGS) ×2 IMPLANT
DRSG SURGICEL FIBRILLAR 1X2 (HEMOSTASIS) ×12
ELECT BLADE 6.5 EXT (BLADE) ×3 IMPLANT
ELECT REM PT RETURN 9FT ADLT (ELECTROSURGICAL) ×3
ELECTRODE REM PT RTRN 9FT ADLT (ELECTROSURGICAL) ×1 IMPLANT
GLOVE BIOGEL PI IND STRL 9 (GLOVE) ×1 IMPLANT
GLOVE BIOGEL PI INDICATOR 9 (GLOVE) ×2
GLOVE SURG SYN 9.0  PF PI (GLOVE) ×6
GLOVE SURG SYN 9.0 PF PI (GLOVE) ×2 IMPLANT
GOWN SRG 2XL LVL 4 RGLN SLV (GOWNS) ×1 IMPLANT
GOWN STRL NON-REIN 2XL LVL4 (GOWNS) ×3
GOWN STRL REUS W/ TWL LRG LVL3 (GOWN DISPOSABLE) ×1 IMPLANT
GOWN STRL REUS W/TWL LRG LVL3 (GOWN DISPOSABLE) ×3
HANDLE YANKAUER SUCT BULB TIP (MISCELLANEOUS) ×2 IMPLANT
HEMOVAC 400CC 10FR (MISCELLANEOUS) IMPLANT
HIP FEM HD S 28 (Head) ×2 IMPLANT
HIP STEM FEM 2 STD (Stem) ×2 IMPLANT
HIP STEM FEM 3 STD (Stem) ×2 IMPLANT
HOLDER FOLEY CATH W/STRAP (MISCELLANEOUS) ×1 IMPLANT
HOOD PEEL AWAY FLYTE STAYCOOL (MISCELLANEOUS) ×3 IMPLANT
KIT PREVENA INCISION MGT 13 (CANNISTER) ×3 IMPLANT
KNIFE SCULPS 14X20 (INSTRUMENTS) ×2 IMPLANT
LINER DM 28MM (Liner) ×3 IMPLANT
LINER DM SZH 28X56 (Liner) IMPLANT
MAT ABSORB  FLUID 56X50 GRAY (MISCELLANEOUS) ×3
MAT ABSORB FLUID 56X50 GRAY (MISCELLANEOUS) ×1 IMPLANT
NDL SAFETY ECLIPSE 18X1.5 (NEEDLE) ×1 IMPLANT
NDL SPNL 20GX3.5 QUINCKE YW (NEEDLE) ×2 IMPLANT
NEEDLE HYPO 18GX1.5 SHARP (NEEDLE) ×3
NEEDLE SPNL 20GX3.5 QUINCKE YW (NEEDLE) ×6 IMPLANT
NOZZLE FLEX THIN (MISCELLANEOUS) ×2 IMPLANT
NS IRRIG 1000ML POUR BTL (IV SOLUTION) ×3 IMPLANT
PACK HIP COMPR (MISCELLANEOUS) ×3 IMPLANT
PENCIL SMOKE EVACUATOR COATED (MISCELLANEOUS) ×3 IMPLANT
PRESSURIZER CEMENT PROX FEM SM (MISCELLANEOUS) ×2 IMPLANT
SCALPEL PROTECTED #10 DISP (BLADE) ×6 IMPLANT
SEALER BIPOLAR AQUA 6.0 (INSTRUMENTS) ×2 IMPLANT
SHELL ACETABULAR DM  56MM (Shell) ×2 IMPLANT
SOL PREP PVP 2OZ (MISCELLANEOUS) ×3
SOLUTION PREP PVP 2OZ (MISCELLANEOUS) ×1 IMPLANT
SPONGE DRAIN TRACH 4X4 STRL 2S (GAUZE/BANDAGES/DRESSINGS) ×1 IMPLANT
STAPLER SKIN PROX 35W (STAPLE) ×3 IMPLANT
STRAP SAFETY 5IN WIDE (MISCELLANEOUS) ×3 IMPLANT
SUT DVC 2 QUILL PDO  T11 36X36 (SUTURE) ×3
SUT DVC 2 QUILL PDO T11 36X36 (SUTURE) ×1 IMPLANT
SUT SILK 0 (SUTURE) ×3
SUT SILK 0 30XBRD TIE 6 (SUTURE) ×1 IMPLANT
SUT V-LOC 90 ABS DVC 3-0 CL (SUTURE) ×3 IMPLANT
SUT VIC AB 1 CT1 36 (SUTURE) ×3 IMPLANT
SYR 20ML LL LF (SYRINGE) ×3 IMPLANT
SYR 30ML LL (SYRINGE) ×3 IMPLANT
SYR 50ML LL SCALE MARK (SYRINGE) ×6 IMPLANT
SYR BULB IRRIG 60ML STRL (SYRINGE) ×3 IMPLANT
TAPE MICROFOAM 4IN (TAPE) ×3 IMPLANT
TOWEL OR 17X26 4PK STRL BLUE (TOWEL DISPOSABLE) ×3 IMPLANT
TRAY FOLEY MTR SLVR 16FR STAT (SET/KITS/TRAYS/PACK) ×1 IMPLANT

## 2019-08-21 NOTE — Op Note (Signed)
08/21/2019  2:43 PM  PATIENT:  Nathan Schultz  64 y.o. male  PRE-OPERATIVE DIAGNOSIS:  Primary osteoarthritis of left hip  POST-OPERATIVE DIAGNOSIS:  Primary osteoarthritis of left hip  PROCEDURE:  Procedure(s): LEFT TOTAL HIP ARTHROPLASTY ANTERIOR APPROACH (Left)  SURGEON: Laurene Footman, MD  ASSISTANTS: None  ANESTHESIA:   general  EBL:  Total I/O In: 1829 [I.V.:700; Blood:816; IV Piggyback:250] Out: 1250 [Blood:1250]  BLOOD ADMINISTERED:1 CC PRBC  DRAINS: Incisional wound VAC   LOCAL MEDICATIONS USED:  MARCAINE    and OTHER Exparel  SPECIMEN:  Source of Specimen:  Left femoral head  DISPOSITION OF SPECIMEN:  PATHOLOGY  COUNTS:  YES  TOURNIQUET:  * No tourniquets in log *  IMPLANTS: Medacta AMIS standard size 2 cemented stem with 56 mm mPACT TM cup and liner with S metal 28 mm head  DICTATION: .Dragon Dictation   The patient was brought to the operating room and after spinal anesthesia was obtained patient was placed on the operative table with the ipsilateral foot into the Medacta attachment, contralateral leg on a well-padded table. C-arm was brought in and preop template x-ray taken. After prepping and draping in usual sterile fashion appropriate patient identification and timeout procedures were completed. Anterior approach to the hip was obtained and centered over the greater trochanter and TFL muscle. The subcutaneous tissue was incised hemostasis being achieved by electrocautery. TFL fascia was incised and the muscle retracted laterally deep retractor placed. The lateral femoral circumflex vessels were identified and ligated. The anterior capsule was exposed and a capsulotomy performed. The neck was identified and a femoral neck cut carried out with a saw. The head was removed without difficulty and showed sclerotic femoral head and acetabulum. Reaming was carried out to 56 mm and a 56 mm cup trial gave appropriate tightness to the acetabular component a 56 DM cup was  impacted into position. The leg was then externally rotated and ischiofemoral and pubofemoral releases carried out. The femur was sequentially broached to a size 4, size 4 stem and as had trials were placed and the final components chosen. The 3 standard cemented stem was inserted after preparation with a cement plug restrictor along with a metal S 28 mm head and 56 mm liner. The hip was reduced and was showed incomplete reduction.  After dislocating the hip the stem came out when trying to remove the head to be able to get better visualization of the cup and it showed there is a small piece of bone interposed centrally so this was removed and a 6 2 stem was then cemented within the canal with and as had and this followed for complete reduction.  the wound was thoroughly irrigated with fibrillar placed along the posterior capsule and medial neck. The deep fascia ws closed using a heavy Quill after infiltration of 30 cc of quarter percent Sensorcaine with epinephrine and Exparel throughout the case with .3-0 V-loc to close the skin with skin staples. Xeroform 4 x 4's ABDs and tape patient was sent to recovery in stable condition.   PLAN OF CARE: Admit to inpatient

## 2019-08-21 NOTE — Anesthesia Procedure Notes (Signed)
Procedure Name: Intubation Date/Time: 08/21/2019 12:16 PM Performed by: Caryl Asp, CRNA Pre-anesthesia Checklist: Patient identified, Patient being monitored, Timeout performed, Emergency Drugs available and Suction available Patient Re-evaluated:Patient Re-evaluated prior to induction Oxygen Delivery Method: Circle system utilized Preoxygenation: Pre-oxygenation with 100% oxygen Induction Type: IV induction Ventilation: Mask ventilation without difficulty Laryngoscope Size: McGraph and 4 Grade View: Grade I Tube type: Oral Tube size: 7.5 mm Number of attempts: 1 Airway Equipment and Method: Stylet Placement Confirmation: ETT inserted through vocal cords under direct vision,  positive ETCO2 and breath sounds checked- equal and bilateral Secured at: 21 cm Tube secured with: Tape Dental Injury: Teeth and Oropharynx as per pre-operative assessment

## 2019-08-21 NOTE — Transfer of Care (Signed)
Immediate Anesthesia Transfer of Care Note  Patient: Nathan Schultz  Procedure(s) Performed: LEFT TOTAL HIP ARTHROPLASTY ANTERIOR APPROACH (Left Hip)  Patient Location: PACU  Anesthesia Type:General  Level of Consciousness: awake and drowsy  Airway & Oxygen Therapy: Patient Spontanous Breathing and Patient connected to face mask oxygen  Post-op Assessment: Post -op Vital signs reviewed and stable  Post vital signs: Reviewed and stable  Last Vitals:  Vitals Value Taken Time  BP 118/55 08/21/19 1444  Temp    Pulse 91 08/21/19 1449  Resp 14 08/21/19 1449  SpO2 100 % 08/21/19 1449  Vitals shown include unvalidated device data.  Last Pain:  Vitals:   08/21/19 1444  TempSrc:   PainSc: (P) Asleep         Complications: No apparent anesthesia complications

## 2019-08-21 NOTE — Anesthesia Preprocedure Evaluation (Signed)
Anesthesia Evaluation  Patient identified by MRN, date of birth, ID band Patient awake    Reviewed: Allergy & Precautions, NPO status , Patient's Chart, lab work & pertinent test results  Airway Mallampati: II       Dental   Pulmonary sleep apnea ,    Pulmonary exam normal        Cardiovascular Normal cardiovascular exam+ Valvular Problems/Murmurs AS      Neuro/Psych  Headaches, negative psych ROS   GI/Hepatic (+) Hepatitis -  Endo/Other  diabetes  Renal/GU Renal disease  negative genitourinary   Musculoskeletal  (+) Arthritis ,   Abdominal Normal abdominal exam  (+)   Peds negative pediatric ROS (+)  Hematology  (+) anemia ,   Anesthesia Other Findings   Reproductive/Obstetrics                             Anesthesia Physical Anesthesia Plan  ASA: III  Anesthesia Plan: General   Post-op Pain Management:    Induction: Intravenous, Rapid sequence and Cricoid pressure planned  PONV Risk Score and Plan:   Airway Management Planned: Oral ETT  Additional Equipment:   Intra-op Plan:   Post-operative Plan: Extubation in OR  Informed Consent: I have reviewed the patients History and Physical, chart, labs and discussed the procedure including the risks, benefits and alternatives for the proposed anesthesia with the patient or authorized representative who has indicated his/her understanding and acceptance.     Dental advisory given  Plan Discussed with: CRNA and Surgeon  Anesthesia Plan Comments:         Anesthesia Quick Evaluation

## 2019-08-21 NOTE — H&P (Signed)
H&P Left Total Hip Arthroplasty, 08/21/2019   Nathan Schultz is a 64 y.o. male who presents today for his surgical history and physical for upcoming left total hip arthroplasty. The patient is scheduled with Dr. Rudene Christians on 08/21/2019. 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 No history of blood clots for the patient. Patient denies any changes in his medical history since he was last evaluated. Pain score today is an 8 out of 10. The patient has had to undergo transfusions for low platelets in the past.  Past Medical History: Past Medical History:  Diagnosis Date  . Allergic state  . Anemia  . Arthritis  . Cellulitis and abscess of leg 10/02/2013  . DM (diabetes mellitus) (CMS-HCC) 10/02/2013  . Hepatic encephalopathy (CMS-HCC) 10/02/2013  . History of cataract  . 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 OSA on CPAP 01/25/2017  8/18 AHI 18, RDI 29  . Stage 3b chronic kidney disease 08/09/2019   Past Surgical History: Past Surgical History:  Procedure Laterality Date  . ARTHROSCOPIC ROTATOR CUFF REPAIR 2010  which according to the patient has come apart  . CHOLECYSTECTOMY 2011  . Neck surgery with anterior fusion at the C5-C6 level 2008  . TONSILLECTOMY childhood   Past Family History: Family History  Problem Relation Age of Onset  . Myocardial Infarction (Heart attack) Mother  . High blood pressure (Hypertension) Mother  deceased  . Alzheimer's disease Mother  deceased  . Hyperlipidemia (Elevated cholesterol) Mother  deceased  . Osteoarthritis Mother  deceased  . Thyroid disease Mother  deceased  . Myocardial Infarction (Heart attack) Father  . Diabetes Father  . High blood pressure (Hypertension) Father  deceased  . Coronary Artery Disease (Blocked arteries around heart) Father  deceased  . Diabetes type II Father   deceased  . Glaucoma Father  deceased  . Prostate cancer Father  also bone cancer/deceased  . Myocardial Infarction (Heart attack) Other  . Diabetes Other  . High blood pressure (Hypertension) Other  . Diabetes type II Brother  deceased  . Diabetes type II Paternal Grandmother  deceased  . Osteoarthritis Maternal Grandmother  deceased   Medications: Current Outpatient Medications Ordered in Epic  Medication Sig Dispense Refill  . albuterol (PROVENTIL HFA) 90 mcg/actuation inhaler Inhale into the lungs  . ascorbic acid (VITAMIN C ORAL) Take by mouth 250-500 mg daily  . BD ULTRA-FINE SHORT PEN NEEDLE 31 gauge x 5/16" needle USE AS DIRECTED 100 each 1  . bifidobacterium infantis (ALIGN) 4 mg capsule Take 2 capsules by mouth once daily  . blood glucose diagnostic test strip 1 strip by XX route once daily Use as instructed. One touch  . chlorhexidine (HIBICLENS) 4 % external wash Uses every day  . cyanocobalamin, vitamin B-12, (VITAMIN B-12 SL) Place under the tongue 5000 under tongue twice a day  . ferrous sulfate 325 (65 FE) MG tablet Take by mouth  . glimepiride (AMARYL) 2 MG tablet 2 tablets in am and 1 at night 270 tablet 3  . insulin GLARGINE (BASAGLAR KWIKPEN U-100 INSULIN) pen injector (concentration 100 units/mL) 40 units in the morning and 15 units in the evening (Patient taking differently: 40 units in the morning and 15-20 units in the evening ) 45 Syringe 4  . KLOR-CON M20 20 mEq ER tablet TAKE 1 TABLET BY MOUTH FOUR TIMES A DAY  360 tablet 3  . loratadine (CLARITIN) 10 mg tablet Take 10 mg by mouth once daily  . magnesium oxide (MAG-OX) 400 mg (241.3 mg magnesium) tablet Take 400 mg by mouth 2 (two) times daily 30 tablet 11  . metOLazone (ZAROXOLYN) 2.5 MG tablet Take 1 tablet (2.5 mg total) by mouth twice a week 30 tablet 11  . mupirocin (BACTROBAN) 2 % cream Apply topically as needed  . NOVOFINE 30 30 gauge x 1/3" needle AS DIRECTED 100 each 1  . spironolactone  (ALDACTONE) 25 MG tablet Take 1 tablet (25 mg total) by mouth once daily  . TORsemide (DEMADEX) 20 MG tablet TAKE 1 TABLET BY MOUTH TWICE A DAY (Patient taking differently: 1 1/2 tabs daily ) 180 tablet 1  . triamcinolone 0.1 % cream APPLY TO AFFECTED AREA TWICE A DAY (Patient taking differently: As needed ) 30 g 2  . TURMERIC ORAL Take by mouth 1000 mg one twice a day  . VITAMIN D3 ORAL Take by mouth 2 tabs daily  . lactulose (ENULOSE) 10 gram/15 mL oral solution Take 30 mLs by mouth once daily for 30 days (Patient taking differently: Take 30 mLs by mouth as needed ) 900 mL 11   No current Epic-ordered facility-administered medications on file.   Allergies: Allergies  Allergen Reactions  . Clindamycin Other (See Comments)  Platelet allergy  . Doxycycline Other (See Comments)  Platelet allergy  . Triamterene-Hydrochlorothiazid Other (See Comments)  Low platelets  . Vancomycin Other (See Comments)  Platelet allergy    Review of Systems:  A comprehensive 14 point ROS was performed, reviewed by me today, and the pertinent orthopaedic findings are documented in the HPI.  Exam: BP 124/72  Ht 175.3 cm (5' 9" )  Wt (!) 109.4 kg (241 lb 3.2 oz)  BMI 35.62 kg/m  General/Constitutional: The patient appears to be well-nourished, well-developed, and in no acute distress. Neuro/Psych: Normal mood and affect, oriented to person, place and time. Eyes: Non-icteric. Pupils are equal, round, and reactive to light, and exhibit synchronous movement. ENT: Unremarkable. Lymphatic: No palpable adenopathy. Respiratory: Lungs clear to auscultation, Normal chest excursion, No wheezes and Non-labored breathing Cardiovascular: Regular rate and rhythm. No murmurs. and No edema, swelling or tenderness, except as noted in detailed exam. Integumentary: No impressive skin lesions present, except as noted in detailed exam. Musculoskeletal: Unremarkable, except as noted in detailed exam.  On exam, the patient  ambulates with a significant limp. He has 30 degrees of internal rotation and 35 degrees of external rotation at the right hip. He has 10 degrees of internal rotation and 20 degrees of external rotation at the left hip that is painful. No flexion contracture noted on the left. No peripheral edema.   Imaging: Previous x-rays of the left hip were obtained in July 2020, these x-rays demonstrate moderate degenerative change of the left hip with decreased joint space of the inferior aspect of the left acetabulum and increased osteophyte formation. No evidence of avascular necrosis.  Impression: Primary osteoarthritis of left hip [M16.12] Primary osteoarthritis of left hip (primary encounter diagnosis) Thrombocytopenia (CMS-HCC)  Plan:  1. Treatment options were discussed today with the patient. 2. The patient is scheduled to undergo a left total hip arthroplasty with Dr. Rudene Christians on 08/21/2019. 3. The patient was instructed on the risk and benefits of surgery and wishes to proceed at this time. 4. This document will serve as a surgical history and physical for the patient. 5. The patient will follow-up per standard postop  protocol. They can call the clinic they have any questions, new symptoms develop or symptoms worsen.  The procedure was discussed with the patient, as were the potential risks (including bleeding, infection, nerve and/or blood vessel injury, persistent or recurrent pain, failure of the hardware, leg length inequality, need for further surgery, blood clots, strokes, heart attacks and/or arhythmias, pneumonia, etc.) and benefits. The patient states his understanding and wishes to proceed.  This office visit took 30 minutes, of which >50% involved patient counseling/education.  Review of the [place] CSRS was performed in accordance of the [place] prior to dispensing any controlled drugs.  This note was generated in part with voice recognition software and I apologize for any typographical errors  that were not detected and corrected.  Raquel James, PA-C [place]   Reviewed paper H+P, will be scanned into chart. No changes noted.

## 2019-08-22 LAB — BPAM RBC
Blood Product Expiration Date: 202104202359
Blood Product Expiration Date: 202104252359
ISSUE DATE / TIME: 202103240414
Unit Type and Rh: 5100
Unit Type and Rh: 5100

## 2019-08-22 LAB — BPAM PLATELET PHERESIS
Blood Product Expiration Date: 202103252359
Blood Product Expiration Date: 202103252359
ISSUE DATE / TIME: 202103231019
ISSUE DATE / TIME: 202103231055
Unit Type and Rh: 5100
Unit Type and Rh: 5100

## 2019-08-22 LAB — GLUCOSE, CAPILLARY
Glucose-Capillary: 337 mg/dL — ABNORMAL HIGH (ref 70–99)
Glucose-Capillary: 364 mg/dL — ABNORMAL HIGH (ref 70–99)
Glucose-Capillary: 387 mg/dL — ABNORMAL HIGH (ref 70–99)
Glucose-Capillary: 362 mg/dL — ABNORMAL HIGH (ref 70–99)
Glucose-Capillary: 398 mg/dL — ABNORMAL HIGH (ref 70–99)
Glucose-Capillary: 340 mg/dL — ABNORMAL HIGH (ref 70–99)

## 2019-08-22 LAB — PREPARE PLATELET PHERESIS
Unit division: 0
Unit division: 0

## 2019-08-22 LAB — TYPE AND SCREEN
ABO/RH(D): O POS
Antibody Screen: NEGATIVE
Unit division: 0
Unit division: 0

## 2019-08-22 LAB — BASIC METABOLIC PANEL
Anion gap: 7 (ref 5–15)
BUN: 43 mg/dL — ABNORMAL HIGH (ref 8–23)
CO2: 22 mmol/L (ref 22–32)
Calcium: 7.7 mg/dL — ABNORMAL LOW (ref 8.9–10.3)
Chloride: 104 mmol/L (ref 98–111)
Creatinine, Ser: 1.87 mg/dL — ABNORMAL HIGH (ref 0.61–1.24)
GFR calc Af Amer: 43 mL/min — ABNORMAL LOW (ref >60)
GFR calc non Af Amer: 37 mL/min — ABNORMAL LOW (ref >60)
Glucose, Bld: 357 mg/dL — ABNORMAL HIGH (ref 70–99)
Potassium: 4.7 mmol/L (ref 3.5–5.1)
Sodium: 133 mmol/L — ABNORMAL LOW (ref 135–145)

## 2019-08-22 LAB — CBC
HCT: 25.1 % — ABNORMAL LOW (ref 39.0–52.0)
Hemoglobin: 9.2 g/dL — ABNORMAL LOW (ref 13.0–17.0)
MCH: 34.5 pg — ABNORMAL HIGH (ref 26.0–34.0)
MCHC: 36.7 g/dL — ABNORMAL HIGH (ref 30.0–36.0)
MCV: 94 fL (ref 80.0–100.0)
Platelets: 104 10*3/uL — ABNORMAL LOW (ref 150–400)
RBC: 2.67 MIL/uL — ABNORMAL LOW (ref 4.22–5.81)
RDW: 13.6 % (ref 11.5–15.5)
WBC: 15.3 10*3/uL — ABNORMAL HIGH (ref 4.0–10.5)
nRBC: 0 % (ref 0.0–0.2)

## 2019-08-22 NOTE — Progress Notes (Signed)
Inpatient Diabetes Program Recommendations  AACE/ADA: New Consensus Statement on Inpatient Glycemic Control   Target Ranges:  Prepandial:   less than 140 mg/dL      Peak postprandial:   less than 180 mg/dL (1-2 hours)      Critically ill patients:  140 - 180 mg/dL  Results for BEREL, NAJJAR (MRN [id number]) as of 08/22/2019 07:57  Ref. Range 08/22/2019 06:06  Glucose Latest Ref Range: 70 - 99 mg/dL 357 (H)   Results for ALI, MOHL (MRN [id number]) as of 08/22/2019 07:57  Ref. Range 08/21/2019 10:26 08/21/2019 14:48 08/21/2019 16:24 08/21/2019 17:41 08/21/2019 21:16  Glucose-Capillary Latest Ref Range: 70 - 99 mg/dL 99 127 (H) 160 (H) 194 (H) 378 (H)    Review of Glycemic Control  Diabetes history: DM2 Outpatient Diabetes medications: Basaglar 40 units QAM, Basaglar 15 units QHS, Amaryl 4 mg QAM, Amaryl 2 mg QPM Current orders for Inpatient glycemic control: Lantus 40 units daily, Lantus 15 units QHS, Amaryl 4 mg QAM, Amaryl 2 mg QPM  Inpatient Diabetes Program Recommendations:   Correction (SSI): While inpatinet, please consider ordering CBGs AC&HS with Novolog 0-15 units TID with meals and Novolog 0-5 units QHS.  Consult: May want to consider consulting hospitalist to assist with DM management while inpatient.  HbgA1C:  A1C 7.6% on 08/21/19 indicating an average glucose of 171 mg/dl over the past 2-3 months.  NOTE: In reviewing chart, noted patient had hip surgery on 08/21/19 and initial glucose 99 mg/dl yesterday morning. Patient received one time dose of Decadron 5 mg at 12:31 on 08/21/19 which is contributing to hyperglycemia. Glucose noted to be 378 mg/dl at bedtime last night and fasting glucose 357 mg/dl today. Recommend using Glycemic Control order set to order CBGs AC&HS with Novolog 0-15 units TID with meals and Novolog 0-5 units QHS. May want to consider consulting hospitalist to assist with DM management while inpatient.  Thanks, Barnie Alderman, RN, MSN, CDE Diabetes  Coordinator Inpatient Diabetes Program [phone] (Team Pager from 8am to 5pm)

## 2019-08-22 NOTE — Progress Notes (Signed)
Inpatient Diabetes Program Recommendations  AACE/ADA: New Consensus Statement on Inpatient Glycemic Control (2015)  Target Ranges:  Prepandial:   less than 140 mg/dL      Peak postprandial:   less than 180 mg/dL (1-2 hours)      Critically ill patients:  140 - 180 mg/dL   Lab Results  Component Value Date   GLUCAP 387 (H) 08/22/2019   HGBA1C 7.6 (H) 08/21/2019    Review of Glycemic Control Results for AVA, DEGUIRE (MRN [id number]) as of 08/22/2019 13:38  Ref. Range 08/21/2019 17:41 08/21/2019 21:16 08/22/2019 08:03 08/22/2019 11:23 08/22/2019 12:33  Glucose-Capillary Latest Ref Range: 70 - 99 mg/dL 194 (H) 378 (H) 337 (H) 364 (H) 387 (H)  Diabetes history: DM2 Outpatient Diabetes medications: Basaglar 40 units QAM, Basaglar 15 units QHS, Amaryl 4 mg QAM, Amaryl 2 mg QPM Current orders for Inpatient glycemic control: Lantus 40 units daily, Lantus 15 units QHS, Amaryl 4 mg QAM, Amaryl 2 mg QPM, Novolog moderate tid with meals  Inpatient Diabetes Program Recommendations:  Note blood sugars increased (received Decadron 5 mg x1 on 3/23).  Anticipate that blood sugars will improve as home medications restarted.  May need meal coverage if post-prandial blood sugars continue to be elevated.    Thanks  Adah Perl, RN, BC-ADM Inpatient Diabetes Coordinator Pager [phone]

## 2019-08-22 NOTE — Progress Notes (Signed)
PT Cancellation Note  Patient Details Name: Mohamud Mrozek MRN: [id number] DOB: Jan 15, 1956   Cancelled Treatment:    Reason Eval/Treat Not Completed: Medical issues which prohibited therapy: Pt's last BG readings >/= 337 falling outside guidelines for participation with PT services.  Will attempt to see pt at a future date/time as medically appropriate.     Linus Salmons PT, DPT 08/22/19, 10:27 AM

## 2019-08-22 NOTE — TOC Progression Note (Signed)
Transition of Care [facility]) - Progression Note    Patient Details  Name: Ibrohim Simmers MRN: [id number] Date of Birth: 1955-10-06  Transition of Care [facility] CM/SW [place], RN Phone Number: 08/22/2019, 11:01 AM  Clinical Narrative:     Requested the price of Lovenox will notify the patient once obtained       Expected Discharge Plan and Services                                                 Social Determinants of Health (SDOH) Interventions    Readmission Risk Interventions No flowsheet data found.

## 2019-08-22 NOTE — Progress Notes (Signed)
Pt found to be unresponsive while on bedside commode. Wife & nurse techs in room. Upon entry, this student nurse found pt to be coming around. A&O x 4, VS stable; BG 362. No complaints of pain, but some nausea. Primary nurse informed. Will continue to monitor & follow POC.

## 2019-08-22 NOTE — Progress Notes (Signed)
   Subjective: 1 Day Post-Op Procedure(s) (LRB): LEFT TOTAL HIP ARTHROPLASTY ANTERIOR APPROACH (Left) Patient reports pain as mild.   Patient is well, and has had no acute complaints or problems Denies any CP, SOB, ABD pain. We will continue therapy today.  Plan is to go Home after hospital stay.  Objective: Vital signs in last 24 hours: Temp:  [97 F (36.1 C)-98.8 F (37.1 C)] 98.6 F (37 C) (03/24 0422) Pulse Rate:  [80-109] 105 (03/24 0422) Resp:  [12-23] 16 (03/24 0422) BP: (92-144)/(49-75) 120/65 (03/24 0422) SpO2:  [97 %-100 %] 98 % (03/24 0422) Weight:  [103.9 kg] 103.9 kg (03/23 1025)  Intake/Output from previous day: 03/23 0701 - 03/24 0700 In: 5460.2 [P.O.:175; I.V.:2395.2; Blood:2240; IV Piggyback:650] Out: 1250 [Blood:1250] Intake/Output this shift: No intake/output data recorded.  Recent Labs    08/21/19 1359 08/21/19 1744 08/22/19 0606  HGB 11.7* 10.4* 9.2*   Recent Labs    08/21/19 1744 08/22/19 0606  WBC 12.8* 15.3*  RBC 2.99* 2.67*  HCT 29.0* 25.1*  PLT 114* 104*   Recent Labs    08/22/19 0606  NA 133*  K 4.7  CL 104  CO2 22  BUN 43*  CREATININE 1.87*  GLUCOSE 357*  CALCIUM 7.7*   No results for input(s): LABPT, INR in the last 72 hours.  EXAM General - Patient is Alert, Appropriate and Oriented Extremity - Neurovascular intact Sensation intact distally Intact pulses distally Dorsiflexion/Plantar flexion intact No cellulitis present Compartment soft Dressing - dressing C/D/I, Praveena intact with scant drainage in canister Motor Function - intact, moving foot and toes well on exam.   Past Medical History:  Diagnosis Date  . Anemia   . Aortic stenosis    moderate  . Arthritis   . Bronchitis    has prn inhaler  . Cancer ([place])    skin cancer  . Cellulitis    legs feet and hand  . Chronic kidney disease    STAGE 3  . Diabetes mellitus without complication ([place])   . Headache    H/O CLUSTER HA'S  . History of ITP   .  History of methicillin resistant staphylococcus aureus (MRSA)   . Liver cirrhosis secondary to nonalcoholic steatohepatitis (NASH) ([place])   . OSA (obstructive sleep apnea)    no CPAP couldn't tolerate  . Seasonal allergies   . UTI (urinary tract infection) 08/05/2019   TAKING CIPRO     Assessment/Plan:   1 Day Post-Op Procedure(s) (LRB): LEFT TOTAL HIP ARTHROPLASTY ANTERIOR APPROACH (Left) Active Problems:   Status post total hip replacement, left  Estimated body mass index is 33.82 kg/m as calculated from the following:   Height as of this encounter: 5' 9"  (1.753 m).   Weight as of this encounter: 103.9 kg. Advance diet Up with therapy  Doing well, pain controlled Needs bowel movement Vital signs are stable Labs are stable.  Platelets 104.  Will recheck labs in the morning Care manager to assist with discharge.  DVT Prophylaxis - TED hose and SCDs Weight-Bearing as tolerated to left leg   T. Rachelle Hora, PA-C [place] 08/22/2019, 7:47 AM

## 2019-08-22 NOTE — Progress Notes (Signed)
PT Cancellation Note  Patient Details Name: Kenyada Hy MRN: [id number] DOB: 13-May-1956   Cancelled Treatment:    Reason Eval/Treat Not Completed: Medical issues which prohibited therapy: Per chart review pt recently found by nursing to be unresponsive while on BSC.  Most recent BG 362 falling outside guidelines for participation with PT services.  Will attempt to see pt at a future date/time as medically appropriate.     Linus Salmons PT, DPT 08/22/19, 3:16 PM

## 2019-08-22 NOTE — TOC Benefit Eligibility Note (Signed)
Transition of Care [facility]) Benefit Eligibility Note    Patient Details  Name: Mahkai Fangman MRN: [id number] Date of Birth: [birth year]   Medication/Dose: LOVENOX  29 MG DAILY X 14 DAYS  SYRINGES  Covered?: Yes  Tier: (TIER- 5 DRUG)  Prescription Coverage [facility]  Spoke with Person/Company/Phone Number:: KYLE  @ [employer]  RX # [phone] OPT- MEMBER  Co-Pay: $38.00     Deductible: (NO DEDUCTIBLE / OUT-OF-POCKET- NOT MET)  Additional Notes: ENOXAPARIN 40 MG DAILY X 14 SYRINGES , COVER- YES , CO-PAY- $38.00, TIER- 5 DRUG, P/A-NO    Memory Argue Phone Number: 08/22/2019, 12:45 PM

## 2019-08-22 NOTE — Anesthesia Postprocedure Evaluation (Signed)
Anesthesia Post Note  Patient: Nathan Schultz  Procedure(s) Performed: LEFT TOTAL HIP ARTHROPLASTY ANTERIOR APPROACH (Left Hip)  Patient location during evaluation: PACU Anesthesia Type: General Level of consciousness: awake and alert and oriented Pain management: pain level controlled Vital Signs Assessment: post-procedure vital signs reviewed and stable Respiratory status: spontaneous breathing Cardiovascular status: blood pressure returned to baseline Anesthetic complications: no     Last Vitals:  Vitals:   08/22/19 0422 08/22/19 0803  BP: 120/65 119/68  Pulse: (!) 105 98  Resp: 16 20  Temp: 37 C 36.7 C  SpO2: 98% 97%    Last Pain:  Vitals:   08/22/19 0803  TempSrc: Oral  PainSc:                  Nathan Schultz

## 2019-08-22 NOTE — Progress Notes (Signed)
Pt oob in chair presently.  Wife at bedside. Pt c/o  Feeling hot and sweaty.  bloodsugar  Retaken  And it was 387. Earlier bs was 360s and 15 units given/ 11units this am given of novolog insulin for  bs of  300s. Diabetes coordinater called and given info on pt. They said they would take a look at him.

## 2019-08-22 NOTE — Evaluation (Signed)
Occupational Therapy Evaluation Patient Details Name: Nathan Schultz MRN: [id number] DOB: 05-22-1956 Today's Date: 08/22/2019    History of Present Illness Pt is a 64 year old male s/p elective L total hip replacement.  Pt's PMH includes arthritis, DM, ITP, and chronic kidney disease   Clinical Impression   Pt seen for OT evaluation this date, POD#1 from above surgery. Pt was independent in all ADLs prior to surgery, however occasionally using SPC or RW for mobility due to L hip pain. Pt is eager to return to PLOF with less pain and improved safety and independence.  Pt is a Theme park manager at [employer] and enjoys doing activities with his church, as well as playing with his dog. Pt currently requires moderate assist for LB dressing and bathing while in seated position due to pain and limited AROM of L hip. Pt instructed in self care skills, falls prevention strategies, home/routines modifications, DME/AE for LB bathing and dressing tasks, pet management, and compression stocking mgt strategies. Pt would benefit from additional instruction in self care skills and techniques to help maintain precautions with or without assistive devices to support recall and carryover prior to discharge. OTR provided min assist for sit to stand and stand pivot transfers.  Pt became nauseous and "clammy" after sit to stand and stand pivot transfer from bed to recliner.  RN aware. Pt's wife is available 24/7 at discharge, recommend [place] upon discharge.      Follow Up Recommendations  Home health OT    Equipment Recommendations  3 in 1 bedside commode;Tub/shower seat    Recommendations for Other Services       Precautions / Restrictions Precautions Precautions: Anterior Hip Restrictions Weight Bearing Restrictions: Yes LLE Weight Bearing: Weight bearing as tolerated      Mobility Bed Mobility Overal bed mobility: Needs Assistance Bed Mobility: Supine to Sit     Supine to sit: Min assist     General bed  mobility comments: assist for LLE  Transfers Overall transfer level: Needs assistance Equipment used: Rolling walker (2 wheeled) Transfers: Sit to/from [facility] Sit to Stand: Min assist Stand pivot transfers: Min guard       General transfer comment: requires additional education on sit to stand transfer technique for safety and sequencing    Balance Overall balance assessment: Needs assistance Sitting-balance support: Bilateral upper extremity supported;Feet supported Sitting balance-Leahy Scale: Good     Standing balance support: Bilateral upper extremity supported Standing balance-Leahy Scale: Fair                             ADL either performed or assessed with clinical judgement   ADL Overall ADL's : Needs assistance/impaired Eating/Feeding: Set up   Grooming: Set up   Upper Body Bathing: Set up   Lower Body Bathing: Minimal assistance   Upper Body Dressing : Set up   Lower Body Dressing: Moderate assistance   Toilet Transfer: Moderate assistance   Toileting- Clothing Manipulation and Hygiene: Min guard   Tub/ Banker: Minimal assistance   Functional mobility during ADLs: Minimal assistance General ADL Comments: Pt requires setup assist for upper body ADLs, requires min-mod assist in lower body dressing and bathing 2/2 L hip pain and limited ROM.  Pt required min A for sit to stand transfer and min guard for functional mobility.     Vision Baseline Vision/History: Wears glasses Wears Glasses: Reading only Patient Visual Report: No change from baseline  Perception     Praxis      Pertinent Vitals/Pain Pain Assessment: Faces Faces Pain Scale: Hurts little more Pain Location: L hip Pain Descriptors / Indicators: Discomfort;Grimacing;Guarding Pain Intervention(s): Limited activity within patient's tolerance;Monitored during session     Hand Dominance     Extremity/Trunk Assessment Upper Extremity  Assessment Upper Extremity Assessment: Overall WFL for tasks assessed   Lower Extremity Assessment Lower Extremity Assessment: Defer to PT evaluation   Cervical / Trunk Assessment Cervical / Trunk Assessment: Normal   Communication Communication Communication: No difficulties   Cognition Arousal/Alertness: Awake/alert Behavior During Therapy: WFL for tasks assessed/performed Overall Cognitive Status: Impaired/Different from baseline Area of Impairment: Awareness                               General Comments: Pt with slightly decreased awareness at end of session after activity- pt with nausea, clamminess, and decreased awareness.  RN aware.   General Comments  pt with nausea and "clamminess" at end of session, RN aware    Exercises Other Exercises Other Exercises: educated pt on fall and safety precautions, self care, compression stocking management, lower body dressing using AE, pet management, functional transfers, and OT role and POC Other Exercises: provided min assist for bed mobility, sit to stand, and stand pivot to recliner   Shoulder Instructions      Home Living Family/patient expects to be discharged to:: Private residence Living Arrangements: Spouse/significant other Available Help at Discharge: Family(pt's wife available 24/7, wife's sister in town to help as well) Type of Home: House Home Access: Stairs to enter;Ramped entrance Entrance Stairs-Number of Steps: 6-8 Entrance Stairs-Rails: Can reach both Home Layout: Two level;Able to live on main level with bedroom/bathroom;Full bath on main level     Bathroom Shower/Tub: Occupational psychologist: [place]     Home Equipment: Environmental consultant - 2 wheels;Walker - 4 wheels;Cane - single point;Shower seat - built in(pt has seat built into wall near shower that could be used as a seat)          Prior Functioning/Environment Level of Independence: Independent        Comments: Pt occasionally  used walker or SPC for community mobility.  Pt is independent in all ADL and IADLs, works as a Theme park manager at [employer].  Pt has had one fall in past 12 months, with rotator cuff injury.  Pt's doctor aware of fall and injuries.        OT Problem List: Decreased strength;Decreased range of motion;Decreased activity tolerance;Impaired balance (sitting and/or standing);Decreased safety awareness;Decreased knowledge of use of DME or AE;Decreased knowledge of precautions;Pain      OT Treatment/Interventions: Self-care/ADL training;Therapeutic exercise;Energy conservation;DME and/or AE instruction;Therapeutic activities;Patient/family education;Balance training    OT Goals(Current goals can be found in the care plan section) Acute Rehab OT Goals Patient Stated Goal: to return to PLOF with decreased pain OT Goal Formulation: With patient Time For Goal Achievement: 09/05/19 Potential to Achieve Goals: Good ADL Goals Pt Will Perform Lower Body Dressing: with modified independence;with supervision;with adaptive equipment;sit to/from stand(with LRAD) Pt Will Transfer to Toilet: with supervision;ambulating(with LRAD and grab bars or 3in1 over toilet) Additional ADL Goal #1: Pt will independently verbalize x3 fall prevention strategies for increased safety and independence in ADLs.  OT Frequency: Min 1X/week   Barriers to D/C:            Co-evaluation  AM-PAC OT "6 Clicks" Daily Activity     Outcome Measure Help from another person eating meals?: None Help from another person taking care of personal grooming?: None Help from another person toileting, which includes using toliet, bedpan, or urinal?: A Little Help from another person bathing (including washing, rinsing, drying)?: A Little Help from another person to put on and taking off regular upper body clothing?: None Help from another person to put on and taking off regular lower body clothing?: A Lot 6 Click Score: 20   End of  Session Equipment Utilized During Treatment: Gait belt;Rolling walker  Activity Tolerance: Patient tolerated treatment well;Other (comment)(pt with nausea after activity) Patient left: in chair;with call bell/phone within reach;with chair alarm set  OT Visit Diagnosis: Other abnormalities of gait and mobility (R26.89);Muscle weakness (generalized) (M62.81)                Time: 1518-3437 OT Time Calculation (min): 46 min Charges:  OT General Charges $OT Visit: 1 Visit OT Evaluation $OT Eval Moderate Complexity: 1 Mod OT Treatments $Self Care/Home Management : 38-52 mins  Myrtie Hawk Solaris Kram, OTR/L 08/22/19, 10:52 AM

## 2019-08-22 NOTE — TOC Initial Note (Signed)
Transition of Care [facility]) - Initial/Assessment Note    Patient Details  Name: Nathan Schultz MRN: [id number] Date of Birth: Oct 03, 1955  Transition of [facility] CM/SW Contact:    Su Hilt, RN Phone Number: 08/22/2019, 3:30 PM  Clinical Narrative:                  Met with the patient and his wife to discuss DC plan and needs He lives at home with his wife  Lovenox price is $38.00  He has DME at home RW, [place], [facility], and a shower seat, no additional DME needed, [facility] will accept for PT  He will have assistance with his wife and sister in law His wife will provide transportation, he is up to date with his PCP and can afford his medications Expected Discharge Plan: [place] Barriers to Discharge: Continued Medical Work up   Patient Goals and CMS Choice        Expected Discharge Plan and Services Expected Discharge Plan: [place]   Discharge Planning Services: CM Consult   Living arrangements for the past 2 months: Single Family Home                 DME Arranged: N/A         HH Arranged: PT HH Agency: [facility] [employer] Date [place]: 08/22/19 Time [place]: [place] Representative spoke with at [place]: [place] Arrangements/Services Living arrangements for the past 2 months: [place] Lives with:: Spouse Patient language and need for interpreter reviewed:: Yes Do you feel safe going back to the place where you live?: Yes      Need for Family Participation in Patient Care: No (Comment) Care giver support system in place?: Yes (comment) Current home services: DME(RW, Can, SHower seat, BSC) Criminal Activity/Legal Involvement Pertinent to Current Situation/Hospitalization: No - Comment as needed  Activities of Daily Living Home Assistive Devices/Equipment: None ADL Screening (condition at time of admission) Patient's cognitive ability  adequate to safely complete daily activities?: Yes Is the patient deaf or have difficulty hearing?: No Does the patient have difficulty seeing, even when wearing glasses/contacts?: No Does the patient have difficulty concentrating, remembering, or making decisions?: No Patient able to express need for assistance with ADLs?: Yes Does the patient have difficulty dressing or bathing?: No Independently performs ADLs?: Yes (appropriate for developmental age) Does the patient have difficulty walking or climbing stairs?: No Weakness of Legs: None Weakness of Arms/Hands: None  Permission Sought/Granted   Permission granted to share information with : Yes, Verbal Permission Granted              Emotional Assessment Appearance:: Appears stated age Attitude/Demeanor/Rapport: Engaged Affect (typically observed): Appropriate Orientation: : Oriented to  Time, Oriented to Situation, Oriented to Place, Oriented to Self Alcohol / Substance Use: Not Applicable Psych Involvement: No (comment)  Admission diagnosis:  Status post total hip replacement, left [Y78.295] Patient Active Problem List   Diagnosis Date Noted  . Status post total hip replacement, left 08/21/2019  . Cellulitis of left lower extremity 08/14/2017  . Abscess of right leg   . Cellulitis of right lower extremity 07/05/2017  . OSA on CPAP 01/25/2017  . History of ITP 05/29/2015  . Controlled type 2 diabetes mellitus without complication, without long-term current use of insulin ([place]) 05/12/2015  . Hepatic encephalopathy ([place]) 10/02/2013  . Non-alcoholic micronodular cirrhosis of liver (  [place]) 10/02/2013   PCP:  Rusty Aus, MD Pharmacy:   [facility], [address] [place] [address] Phone: [phone] Fax: [phone]  [id number] [id number]: [phone]: [phone] [address] [place] [address] [address]Phone: [phone]: [phone]    Social Determinants of Health (SDOH) Interventions    Readmission Risk Interventions No flowsheet data found.

## 2019-08-23 LAB — GLUCOSE, CAPILLARY
Glucose-Capillary: 296 mg/dL — ABNORMAL HIGH (ref 70–99)
Glucose-Capillary: 292 mg/dL — ABNORMAL HIGH (ref 70–99)
Glucose-Capillary: 305 mg/dL — ABNORMAL HIGH (ref 70–99)
Glucose-Capillary: 259 mg/dL — ABNORMAL HIGH (ref 70–99)

## 2019-08-23 LAB — CBC
HCT: 24.6 % — ABNORMAL LOW (ref 39.0–52.0)
Hemoglobin: 9.1 g/dL — ABNORMAL LOW (ref 13.0–17.0)
MCH: 34.3 pg — ABNORMAL HIGH (ref 26.0–34.0)
MCHC: 37 g/dL — ABNORMAL HIGH (ref 30.0–36.0)
MCV: 92.8 fL (ref 80.0–100.0)
Platelets: 137 10*3/uL — ABNORMAL LOW (ref 150–400)
RBC: 2.65 MIL/uL — ABNORMAL LOW (ref 4.22–5.81)
RDW: 13.9 % (ref 11.5–15.5)
WBC: 23 10*3/uL — ABNORMAL HIGH (ref 4.0–10.5)
nRBC: 0 % (ref 0.0–0.2)

## 2019-08-23 LAB — SURGICAL PATHOLOGY

## 2019-08-23 LAB — AMMONIA: Ammonia: 81 umol/L — ABNORMAL HIGH (ref 9–35)

## 2019-08-23 MED ORDER — OXYCODONE HCL 5 MG PO TABS
5.0000 mg | ORAL_TABLET | ORAL | Status: DC | PRN
  Administered 2019-08-24 – 2019-08-25 (×2): 5 mg via ORAL
  Filled 2019-08-23 (×2): qty 1

## 2019-08-23 MED ORDER — LACTULOSE 10 GM/15ML PO SOLN
20.0000 g | Freq: Every day | ORAL | Status: DC
  Administered 2019-08-23 – 2019-08-27 (×4): 20 g via ORAL
  Filled 2019-08-23 (×4): qty 30

## 2019-08-23 NOTE — Progress Notes (Signed)
Physical Therapy Treatment Patient Details Name: Nathan Schultz MRN: [id number] DOB: 01-23-1956 Today's Date: 08/23/2019    History of Present Illness Pt is a 64 year old male s/p elective L total hip replacement.  Pt's PMH includes arthritis, DM, ITP, and chronic kidney disease    PT Comments    Pt was mildly lethargic this afternoon and appeared to have difficulty with maintaining attention during tasks such as counting reps with therex. Pt was able to tolerate sitting much better than this morning despite having the same orthostatic response. Pt's BP in supine to start the session was 138/69, with sitting EOB it was 109/65, and after five minutes of sitting EOB, BP was 140/74. Despite have a systolic BP drop of around 30 points similar to this morning, pt was relatively asymptomatic and his BP recovered in several minutes. Upon standing, pt became symptomatic, mostly nausea, and requested to return to sitting.  In standing, pt was able to do some weight-shifting and marching before complaints of nausea. Given that pt has still not been able to ambulate secondary to medical symptoms, discharge recommendation remains SNF, but pt should be followed closely to see if functional abilities improve and is later more appropriate for HHPT. Pt will benefit from PT services in a SNF setting upon discharge to safely address deficits listed in patient problem list for decreased caregiver assistance and eventual return to PLOF.    Follow Up Recommendations  SNF     Equipment Recommendations  None recommended by PT    Recommendations for Other Services       Precautions / Restrictions Precautions Precautions: Anterior Hip Precaution Booklet Issued: Yes (comment) Restrictions Weight Bearing Restrictions: Yes LLE Weight Bearing: Weight bearing as tolerated    Mobility  Bed Mobility Overal bed mobility: Needs Assistance Bed Mobility: Supine to Sit;Sit to Supine     Supine to sit:  Supervision Sit to supine: Min assist   General bed mobility comments: For supine to sit, extra time and effort required. For sit to supine, pt needed min assist for his LLE to get his foot over the EOB. Pt demonstrated good strategies to complete bed mobility  Transfers Overall transfer level: Needs assistance Equipment used: Rolling walker (2 wheeled) Transfers: Sit to/from Stand Sit to Stand: Min guard         General transfer comment: extra time and effort, appeared slightly unsteady but no instances of LOB  Ambulation/Gait             General Gait Details: unable/unsafe to attempt secondary to reportd of nase[place] and requests to sit back down   Stairs             Wheelchair Mobility    Modified Rankin (Stroke Patients Only)       Balance Overall balance assessment: Needs assistance Sitting-balance support: No upper extremity supported;Feet unsupported Sitting balance-Leahy Scale: Good Sitting balance - Comments: Pt was able to maintain upright sitting posture   Standing balance support: Bilateral upper extremity supported Standing balance-Leahy Scale: Fair Standing balance comment: able to stand with light-mod BUE assist through RW                            Cognition Arousal/Alertness: Lethargic Behavior During Therapy: [facility] for tasks assessed/performed Overall Cognitive Status: Difficult to assess  General Comments: Pt was mildly lethargic and occasionally had difficulty with paying sustained attention with therex      Exercises Total Joint Exercises Ankle Circles/Pumps: AROM;Strengthening;Both;10 reps Quad Sets: Strengthening;Both;10 reps Gluteal Sets: Strengthening;Both;10 reps Towel Squeeze: Strengthening;Both;10 reps Hip ABduction/ADduction: AAROM;Both;10 reps Straight Leg Raises: AAROM;Both;10 reps Long Arc Quad: AROM;Strengthening;Both;10 Theatre manager in Standing:  AROM;Strengthening;Both;10 reps Other Exercises Other Exercises: exercise packet reviewed    General Comments        Pertinent Vitals/Pain Pain Assessment: 0-10 Pain Score: 8  Pain Location: L hip Pain Descriptors / Indicators: Discomfort;Grimacing;Guarding;Aching;Sore Pain Intervention(s): Monitored during session;Premedicated before session    [place] expects to be discharged to:: Private residence Living Arrangements: Spouse/significant other Available Help at Discharge: Family Type of Home: House Home Access: [place]: Two level;Able to live on main level with bedroom/bathroom;Full bath on main level Home Equipment: Walker - 2 wheels;Walker - 4 wheels;Cane - single point;Bedside commode      Prior Function Level of Independence: Independent      Comments: Per pt: ind community amb, no AD use, ind with ADL's, one fall in past six months on stairs in the dark.   PT Goals (current goals can now be found in the care plan section) Acute Rehab PT Goals Patient Stated Goal: "Walk without getting pale" Progress towards PT goals: Progressing toward goals    Frequency    BID      PT Plan Current plan remains appropriate    Co-evaluation              AM-PAC PT "6 Clicks" Mobility   Outcome Measure  Help needed turning from your back to your side while in a flat bed without using bedrails?: A Little Help needed moving from lying on your back to sitting on the side of a flat bed without using bedrails?: A Little Help needed moving to and from a bed to a chair (including a wheelchair)?: A Lot Help needed standing up from a chair using your arms (e.g., wheelchair or bedside chair)?: A Little Help needed to walk in hospital room?: A Lot Help needed climbing 3-5 steps with a railing? : Total 6 Click Score: 14    End of Session Equipment Utilized During Treatment: Gait belt Activity Tolerance: Other (comment)(pt limited by  symptoms expereinced when standing) Patient left: in bed;with call bell/phone within reach;with bed alarm set;with SCD's reapplied;with family/visitor present Nurse Communication: Mobility status;Precautions;Weight bearing status;Other (comment) PT Visit Diagnosis: Unsteadiness on feet (R26.81);Other abnormalities of gait and mobility (R26.89);History of falling (Z91.81);Muscle weakness (generalized) (M62.81);Pain Pain - Right/Left: Left Pain - part of body: Hip     Time: 4268-3419 PT Time Calculation (min) (ACUTE ONLY): 38 min  Charges:  $Therapeutic Exercise: 8-22 mins                     Annabelle Harman, SPT 08/23/19 3:11 PM

## 2019-08-23 NOTE — Evaluation (Signed)
Physical Therapy Evaluation Patient Details Name: Nathan Schultz MRN: [id number] DOB: 12-15-1955 Today's Date: 08/23/2019   History of Present Illness  Pt is a 64 year old male s/p elective L total hip replacement.  Pt's PMH includes arthritis, DM, ITP, and chronic kidney disease.   Clinical Impression  Pt pleasant and motivated to participate during the session. When pt sat up EOB, pt began to experience nausea and clamminess and requested to return to supine where most of his symptoms resolved within a few minutes. At beginning of session, pt's supine BP was 134/68 and when sitting EOB, BP was 110/68. At end of session, pt noted "feeling out of it" even after other symptoms resolved. Based on what pt was able to do this morning, current discharge recommendation is SNF, but pt will be followed closely and progress will be tracked to see if pt's functional abilities increase once medical problems resolve. Pt will benefit from PT services in a SNF setting upon discharge to safely address deficits listed in patient problem list for decreased caregiver assistance and eventual return to PLOF.     Follow Up Recommendations SNF    Equipment Recommendations  None recommended by PT    Recommendations for Other Services       Precautions / Restrictions Precautions Precautions: Anterior Hip Precaution Booklet Issued: Yes (comment) Restrictions Weight Bearing Restrictions: Yes LLE Weight Bearing: Weight bearing as tolerated      Mobility  Bed Mobility Overal bed mobility: Needs Assistance Bed Mobility: Supine to Sit;Sit to Supine     Supine to sit: Supervision Sit to supine: Min assist   General bed mobility comments: For supine to sit, extra time, effort, and encouragement required. For sit to supine, pt needed min assist for his LLE to get his foot over the EOB  Transfers                 General transfer comment: Pt unable to tolerate an attempt due to symptoms sitting  EOB  Ambulation/Gait                Stairs            Wheelchair Mobility    Modified Rankin (Stroke Patients Only)       Balance Overall balance assessment: Needs assistance Sitting-balance support: Bilateral upper extremity supported;Feet supported Sitting balance-Leahy Scale: Fair Sitting balance - Comments: Pt was able to maintain upright sitting posture but began to feel nauseous and clammy and returned to supine                                     Pertinent Vitals/Pain Pain Assessment: 0-10 Pain Score: 8  Pain Location: L hip Pain Descriptors / Indicators: Discomfort;Grimacing;Guarding;Aching;Sore Pain Intervention(s): Monitored during session;Premedicated before session    [place] expects to be discharged to:: Private residence Living Arrangements: Spouse/significant other Available Help at Discharge: Family Type of Home: House Home Access: [place]: Two level;Able to live on main level with bedroom/bathroom;Full bath on main level Home Equipment: Walker - 2 wheels;Walker - 4 wheels;Cane - single point;Bedside commode      Prior Function Level of Independence: Independent         Comments: Per pt: ind community amb, no AD use, ind with ADL's, one fall in past six months on stairs in the dark.     Hand Dominance  Extremity/Trunk Assessment        Lower Extremity Assessment Lower Extremity Assessment: Generalized weakness;LLE deficits/detail LLE: Unable to fully assess due to pain       Communication   Communication: No difficulties  Cognition Arousal/Alertness: Awake/alert Behavior During Therapy: WFL for tasks assessed/performed                                   General Comments: Pt stated that he felt "a little bit our of it" at the end of session but was able to follow commands and respond appropriately      General Comments      Exercises  Total Joint Exercises Ankle Circles/Pumps: AROM;Strengthening;Both;10 reps Quad Sets: Strengthening;Both;10 reps Gluteal Sets: Strengthening;Both;10 reps Hip ABduction/ADduction: AAROM;Both;10 reps Straight Leg Raises: AAROM;Both;10 reps   Assessment/Plan    PT Assessment Patient needs continued PT services  PT Problem List Decreased strength;Decreased mobility;Decreased safety awareness;Decreased range of motion;Decreased knowledge of precautions;Decreased activity tolerance;Decreased balance;Decreased knowledge of use of DME;Pain       PT Treatment Interventions DME instruction;Therapeutic exercise;Gait training;Balance training;Stair training;Functional mobility training;Therapeutic activities;Patient/family education    PT Goals (Current goals can be found in the Care Plan section)  Acute Rehab PT Goals Patient Stated Goal: "Walk without getting pale"    Frequency BID   Barriers to discharge        Co-evaluation               AM-PAC PT "6 Clicks" Mobility  Outcome Measure Help needed turning from your back to your side while in a flat bed without using bedrails?: A Little Help needed moving from lying on your back to sitting on the side of a flat bed without using bedrails?: A Little Help needed moving to and from a bed to a chair (including a wheelchair)?: A Lot Help needed standing up from a chair using your arms (e.g., wheelchair or bedside chair)?: A Lot Help needed to walk in hospital room?: Total Help needed climbing 3-5 steps with a railing? : Total 6 Click Score: 12    End of Session Equipment Utilized During Treatment: Gait belt Activity Tolerance: Other (comment)(pt limited by symptoms expereinced when sitting EOB) Patient left: in bed;with call bell/phone within reach;with bed alarm set;with nursing/sitter in room;with SCD's reapplied Nurse Communication: Mobility status;Precautions;Weight bearing status;Other (comment)(Pt became nauseous and clammy  sitting EOB and "felt out of it" when returned to supine) PT Visit Diagnosis: Unsteadiness on feet (R26.81);Other abnormalities of gait and mobility (R26.89);History of falling (Z91.81);Muscle weakness (generalized) (M62.81);Pain Pain - Right/Left: Left Pain - part of body: Hip    Time: 5573-2202 PT Time Calculation (min) (ACUTE ONLY): 36 min   Charges:             Annabelle Harman, SPT 08/23/19 12:49 PM

## 2019-08-23 NOTE — Progress Notes (Signed)
Inpatient Diabetes Program Recommendations  AACE/ADA: New Consensus Statement on Inpatient Glycemic Control (2015)  Target Ranges:  Prepandial:   less than 140 mg/dL      Peak postprandial:   less than 180 mg/dL (1-2 hours)      Critically ill patients:  140 - 180 mg/dL   Lab Results  Component Value Date   GLUCAP 292 (H) 08/23/2019   HGBA1C 7.6 (H) 08/21/2019    Review of Glycemic ControlResults for JAKIE, DEBOW (MRN [id number]) as of 08/23/2019 13:33  Ref. Range 08/22/2019 14:19 08/22/2019 17:02 08/22/2019 21:19 08/23/2019 08:24 08/23/2019 11:25  Glucose-Capillary Latest Ref Range: 70 - 99 mg/dL 362 (H) 398 (H) 340 (H) 296 (H) 292 (H)   Diabetes history:DM2 Outpatient Diabetes medications:Basaglar 40 units QAM, Basaglar 15 units QHS, Amaryl 4 mg QAM, Amaryl 2 mg QPM Current orders for Inpatient glycemic control:Lantus 40 units daily, Lantus 15 units QHS, Amaryl 4 mg QAM, Amaryl 2 mg QPM, Novolog moderate tid with meals  Inpatient Diabetes Program Recommendations:  Please consider adding Novolog meal coverage 4 units tid with meals.  Also may need slight increase in Lantus at bedtime to 25 units q HS.    Thanks,  Adah Perl, RN, BC-ADM Inpatient Diabetes Coordinator Pager [phone] (8a-5p)

## 2019-08-23 NOTE — Progress Notes (Signed)
OT Cancellation Note  Patient Details Name: Nathan Schultz MRN: [id number] DOB: 06/13/1955   Cancelled Treatment:    Reason Eval/Treat Not Completed: Fatigue/lethargy limiting ability to participate;Pain limiting ability to participate. Upon attempt, pt sleeping, spouse present. Pt wakes easily, reporting 8/10 pain and fatigue after recent therapy session. Pt/spouse politely declining OT tx this afternoon. Agreeable to re-attempt next date, pending improved pain control.  Jeni Salles, MPH, MS, OTR/L ascom [phone] 08/23/19, 4:02 PM

## 2019-08-23 NOTE — Progress Notes (Signed)
   Subjective: 2 Days Post-Op Procedure(s) (LRB): LEFT TOTAL HIP ARTHROPLASTY ANTERIOR APPROACH (Left) Patient reports pain as mild.   Patient is well, and has had no acute complaints or problems Denies any CP, SOB, ABD pain, N/V We will start physical therapy today.  Plan is to go Home after hospital stay.  Objective: Vital signs in last 24 hours: Temp:  [97.8 F (36.6 C)-98.6 F (37 C)] 98.6 F (37 C) (03/25 0823) Pulse Rate:  [95-110] 110 (03/25 0823) Resp:  [17-20] 18 (03/25 0823) BP: (104-134)/(59-68) 124/67 (03/25 0823) SpO2:  [94 %-98 %] 94 % (03/25 0823)  Intake/Output from previous day: 03/24 0701 - 03/25 0700 In: -  Out: 1175 [Urine:1175] Intake/Output this shift: No intake/output data recorded.  Recent Labs    08/21/19 1359 08/21/19 1744 08/22/19 0606 08/23/19 0535  HGB 11.7* 10.4* 9.2* 9.1*   Recent Labs    08/22/19 0606 08/23/19 0535  WBC 15.3* 23.0*  RBC 2.67* 2.65*  HCT 25.1* 24.6*  PLT 104* 137*   Recent Labs    08/22/19 0606  NA 133*  K 4.7  CL 104  CO2 22  BUN 43*  CREATININE 1.87*  GLUCOSE 357*  CALCIUM 7.7*   No results for input(s): LABPT, INR in the last 72 hours.  EXAM General - Patient is Alert, Appropriate and Oriented Extremity - Neurovascular intact Sensation intact distally Intact pulses distally Dorsiflexion/Plantar flexion intact No cellulitis present Compartment soft Dressing - dressing C/D/I, Praveena intact with scant drainage in canister, 25 cc. Small Blisters x 2 present along tecaderm. No open wounds. Motor Function - intact, moving foot and toes well on exam.   Past Medical History:  Diagnosis Date  . Anemia   . Aortic stenosis    moderate  . Arthritis   . Bronchitis    has prn inhaler  . Cancer ([place])    skin cancer  . Cellulitis    legs feet and hand  . Chronic kidney disease    STAGE 3  . Diabetes mellitus without complication ([place])   . Headache    H/O CLUSTER HA'S  . History of ITP   .  History of methicillin resistant staphylococcus aureus (MRSA)   . Liver cirrhosis secondary to nonalcoholic steatohepatitis (NASH) ([place])   . OSA (obstructive sleep apnea)    no CPAP couldn't tolerate  . Seasonal allergies   . UTI (urinary tract infection) 08/05/2019   TAKING CIPRO     Assessment/Plan:   2 Days Post-Op Procedure(s) (LRB): LEFT TOTAL HIP ARTHROPLASTY ANTERIOR APPROACH (Left) Active Problems:   Status post total hip replacement, left  Estimated body mass index is 33.82 kg/m as calculated from the following:   Height as of this encounter: 5' 9"  (1.753 m).   Weight as of this encounter: 103.9 kg. Advance diet Up with therapy  Doing well, pain controlled Needs bowel movement Vital signs are stable Labs are stable.  Care manager to assist with discharge.  DVT Prophylaxis - TED hose and SCDs Weight-Bearing as tolerated to left leg   T. Rachelle Hora, PA-C [place] 08/23/2019, 8:55 AM

## 2019-08-24 LAB — CBC
HCT: 23.5 % — ABNORMAL LOW (ref 39.0–52.0)
Hemoglobin: 8.8 g/dL — ABNORMAL LOW (ref 13.0–17.0)
MCH: 34.9 pg — ABNORMAL HIGH (ref 26.0–34.0)
MCHC: 37.4 g/dL — ABNORMAL HIGH (ref 30.0–36.0)
MCV: 93.3 fL (ref 80.0–100.0)
Platelets: 123 10*3/uL — ABNORMAL LOW (ref 150–400)
RBC: 2.52 MIL/uL — ABNORMAL LOW (ref 4.22–5.81)
RDW: 13.8 % (ref 11.5–15.5)
WBC: 19.3 10*3/uL — ABNORMAL HIGH (ref 4.0–10.5)
nRBC: 0 % (ref 0.0–0.2)

## 2019-08-24 LAB — GLUCOSE, CAPILLARY
Glucose-Capillary: 225 mg/dL — ABNORMAL HIGH (ref 70–99)
Glucose-Capillary: 188 mg/dL — ABNORMAL HIGH (ref 70–99)
Glucose-Capillary: 280 mg/dL — ABNORMAL HIGH (ref 70–99)
Glucose-Capillary: 260 mg/dL — ABNORMAL HIGH (ref 70–99)
Glucose-Capillary: 282 mg/dL — ABNORMAL HIGH (ref 70–99)
Glucose-Capillary: 265 mg/dL — ABNORMAL HIGH (ref 70–99)

## 2019-08-24 LAB — BASIC METABOLIC PANEL
Anion gap: 9 (ref 5–15)
BUN: 86 mg/dL — ABNORMAL HIGH (ref 8–23)
CO2: 27 mmol/L (ref 22–32)
Calcium: 8.3 mg/dL — ABNORMAL LOW (ref 8.9–10.3)
Chloride: 93 mmol/L — ABNORMAL LOW (ref 98–111)
Creatinine, Ser: 2.5 mg/dL — ABNORMAL HIGH (ref 0.61–1.24)
GFR calc Af Amer: 30 mL/min — ABNORMAL LOW (ref >60)
GFR calc non Af Amer: 26 mL/min — ABNORMAL LOW (ref >60)
Glucose, Bld: 264 mg/dL — ABNORMAL HIGH (ref 70–99)
Potassium: 3.9 mmol/L (ref 3.5–5.1)
Sodium: 129 mmol/L — ABNORMAL LOW (ref 135–145)

## 2019-08-24 MED ORDER — SODIUM CHLORIDE 0.9 % IV BOLUS
500.0000 mL | Freq: Once | INTRAVENOUS | Status: AC
  Administered 2019-08-25: 500 mL via INTRAVENOUS

## 2019-08-24 MED ORDER — OXYCODONE HCL 5 MG PO TABS: 5.0000 mg | ORAL_TABLET | ORAL | 0 refills | Status: AC | PRN

## 2019-08-24 MED ORDER — METHOCARBAMOL 500 MG PO TABS: 500.0000 mg | ORAL_TABLET | Freq: Four times a day (QID) | ORAL | 0 refills | Status: AC | PRN

## 2019-08-24 NOTE — Progress Notes (Signed)
D: Pt alert and oriented x 4. Pt denies experiencing any pain at this time. Pt is a one assist the BSC with RW. Pt has been OOB to chair with PT this morning. PT stated pt walked some this afternoon and did better in comparison to this morning.  A: Scheduled medications administered to pt, per MD orders. Support and encouragement provided. Frequent verbal contact made.    R: No adverse drug reactions noted. Pt complaint with medications and treatment plan. Pt interacts well with staff on the unit. Pt is stable at this time, Will continue to monitor and provide care for as ordered.

## 2019-08-24 NOTE — Care Management Important Message (Signed)
Important Message  Patient Details  Name: Nathan Schultz MRN: [id number] Date of Birth: January 13, 1956   Medicare Important Message Given:  Yes     [name] 08/24/2019, 11:25 AM

## 2019-08-24 NOTE — Progress Notes (Signed)
Physical Therapy Treatment Patient Details Name: Nathan Schultz MRN: [id number] DOB: 1956/01/04 Today's Date: 08/24/2019    History of Present Illness Pt is a 64 year old male s/p elective L total hip replacement.  Pt's PMH includes arthritis, DM, ITP, and chronic kidney disease    PT Comments    Pt pleasant and motivated to participate during the session. While pt was still generally drowsy and lethargic, it was to a lesser extent than previous visits. Pt was able to ambulate around the hospital room with a RW with a very slow and cautious gait. After about 15 or 20 feet, pt became much more unsteady and needed cueing to safely return to a seated position. While pt made good progress this afternoon, pt is still unsafe to return to prior living situation secondary to increased risk from falls from instability with ambulation and lethargy. Pt should be followed closely to determine if a change in discharge planning is necessary if pt's physical abilities change as pt's medical impairments improve. Currently, pt will most benefit from PT services in a SNF setting upon discharge to safely address deficits listed in patient problem list for decreased caregiver assistance and eventual return to PLOF.    Follow Up Recommendations  SNF     Equipment Recommendations  None recommended by PT    Recommendations for Other Services       Precautions / Restrictions Precautions Precautions: Anterior Hip Precaution Booklet Issued: No Restrictions Weight Bearing Restrictions: Yes LLE Weight Bearing: Weight bearing as tolerated    Mobility  Bed Mobility Overal bed mobility: Needs Assistance Bed Mobility: Supine to Sit     Supine to sit: Supervision   General bed mobility comments: No physical assistance needed for bed mobility  Transfers Overall transfer level: Needs assistance Equipment used: Rolling walker (2 wheeled) Transfers: Sit to/from Stand Sit to Stand: Min assist;From elevated  surface         General transfer comment: VC to enourage even WB through BLE  Ambulation/Gait Ambulation/Gait assistance: Min guard Gait Distance (Feet): 25 Feet Assistive device: Rolling walker (2 wheeled) Gait Pattern/deviations: Decreased stride length;Trunk flexed;Antalgic Gait velocity: decreased   General Gait Details: Pt able to ambulate small laps within room without symptoms other than pain. As pt became tired, pt needed VC to slow down and to walk inside of the the RW. No instances of LOB but pt showed increasing instability as he continued to ambulate.   Stairs             Wheelchair Mobility    Modified Rankin (Stroke Patients Only)       Balance Overall balance assessment: Needs assistance Sitting-balance support: No upper extremity supported;Feet supported Sitting balance-Leahy Scale: Fair Sitting balance - Comments: able to sit EOB without LOB   Standing balance support: Bilateral upper extremity supported;During functional activity Standing balance-Leahy Scale: Fair Standing balance comment: min-mod BUE through RW                            Cognition Arousal/Alertness: Lethargic Behavior During Therapy: WFL for tasks assessed/performed Overall Cognitive Status: Impaired/Different from baseline                                 General Comments: Per pt and pt's wife, pt has some occasional cognitive and alertness deficits at baseline related to co-morbidites but never quite to this extent or  duration in terms of lethragy and drowsiness.      Exercises Total Joint Exercises Long Arc Quad: AROM;Strengthening;Both;20 reps Marching in Standing: AROM;Strengthening;Both;10 reps    General Comments       Pertinent Vitals/Pain Pain Assessment: 0-10 Pain Score: 7  Pain Location: L hip Pain Descriptors / Indicators: Aching;Guarding Pain Intervention(s): Monitored during session;Premedicated before session    Home Living                       Prior Function            PT Goals (current goals can now be found in the care plan section) Acute Rehab PT Goals Patient Stated Goal: "Walk without getting pale" Progress towards PT goals: Progressing toward goals    Frequency    BID      PT Plan Current plan remains appropriate    Co-evaluation              AM-PAC PT "6 Clicks" Mobility   Outcome Measure  Help needed turning from your back to your side while in a flat bed without using bedrails?: A Little Help needed moving from lying on your back to sitting on the side of a flat bed without using bedrails?: A Little Help needed moving to and from a bed to a chair (including a wheelchair)?: A Little Help needed standing up from a chair using your arms (e.g., wheelchair or bedside chair)?: A Little Help needed to walk in hospital room?: A Little Help needed climbing 3-5 steps with a railing? : A Lot 6 Click Score: 17    End of Session Equipment Utilized During Treatment: Gait belt Activity Tolerance: Patient tolerated treatment well Patient left: in chair;with call bell/phone within reach;with chair alarm set;with family/visitor present;with SCD's reapplied Nurse Communication: Mobility status;Precautions;Weight bearing status PT Visit Diagnosis: Unsteadiness on feet (R26.81);Other abnormalities of gait and mobility (R26.89);History of falling (Z91.81);Muscle weakness (generalized) (M62.81);Pain Pain - Right/Left: Left Pain - part of body: Hip     Time: 5183-4373 PT Time Calculation (min) (ACUTE ONLY): 40 min  Charges:                        Annabelle Harman, SPT 08/24/19 5:03 PM

## 2019-08-24 NOTE — TOC Progression Note (Signed)
Transition of Care [facility]) - Progression Note    Patient Details  Name: Nathan Schultz MRN: [id number] Date of Birth: [birth year]  [facility]) CM/SW [place], RN Phone Number: 08/24/2019, 2:32 PM  Clinical Narrative:    Spoke with Dr. Rudene Christians, the patient will need to go to SNF for rehab. I completed the FL2, PASSR, Bedsearch, awaiting bed offers, need the patient to make a choice then need Holli Humbles.     Expected Discharge Plan: [place] Barriers to Discharge: Continued Medical Work up  Expected Discharge Plan and Services Expected Discharge Plan: [place]   Discharge Planning Services: CM Consult   Living arrangements for the past 2 months: Single Family Home                 DME Arranged: N/A         HH Arranged: PT HH Agency: [facility] [employer] Date [place]: 08/22/19 Time [place]: [place] Representative spoke with at [employer]: [place] ([place]) Interventions    Readmission Risk Interventions No flowsheet data found.

## 2019-08-24 NOTE — Discharge Summary (Signed)
Physician Discharge Summary  Patient ID: Nathan Schultz MRN: [id number] DOB/AGE: [birth year] 66 y.o.  Admit date: 08/21/2019 Discharge date: 08/27/2019 Admission Diagnoses:  Status post total hip replacement, left [Z96.642]   Discharge Diagnoses: Patient Active Problem List   Diagnosis Date Noted  . Status post total hip replacement, left 08/21/2019  . Cellulitis of left lower extremity 08/14/2017  . Abscess of right leg   . Cellulitis of right lower extremity 07/05/2017  . OSA on CPAP 01/25/2017  . History of ITP 05/29/2015  . Controlled type 2 diabetes mellitus without complication, without long-term current use of insulin ([place]) 05/12/2015  . Hepatic encephalopathy ([place]) 10/02/2013  . Non-alcoholic micronodular cirrhosis of liver ([place]) 10/02/2013    Past Medical History:  Diagnosis Date  . Anemia   . Aortic stenosis    moderate  . Arthritis   . Bronchitis    has prn inhaler  . Cancer ([place])    skin cancer  . Cellulitis    legs feet and hand  . Chronic kidney disease    STAGE 3  . Diabetes mellitus without complication ([place])   . Headache    H/O CLUSTER HA'S  . History of ITP   . History of methicillin resistant staphylococcus aureus (MRSA)   . Liver cirrhosis secondary to nonalcoholic steatohepatitis (NASH) ([place])   . OSA (obstructive sleep apnea)    no CPAP couldn't tolerate  . Seasonal allergies   . UTI (urinary tract infection) 08/05/2019   TAKING CIPRO      Transfusion: none   Consultants (if any):   Discharged Condition: Improved  Hospital Course: Nathan Schultz is an 64 y.o. male who was admitted 08/21/2019 with a diagnosis of left hip osteoarthritis and went to the operating room on 08/21/2019 and underwent the above named procedures.    Surgeries: Procedure(s): LEFT TOTAL HIP ARTHROPLASTY ANTERIOR APPROACH on 08/21/2019 Patient tolerated the surgery well. Taken to PACU where she was stabilized and then transferred to the orthopedic floor.  Started on TEDs  SCDs.  No evidence of DVT. Negative [place]. Physical therapy started on day #1 for gait training and transfer. OT started day #1 for ADL and assisted devices.  Patient's foley was d/c on day #1. Patient's IV was d/c on day #2.  On postop day 2 adjustments were made to insulin due to hyperglycemia, blood glucoses are better regulated by postop day 3.  On postop day 3 patient noted to have blister formation around the Tegaderm of the left hip [place].  This was covered with abdominal pads and paper tape.  Blister intact.  No signs of any cellulitis.  On postop day 3 patient continuing to make slow progress with physical therapy.  Vital signs are stable.  On postop day 4 5 and 6, patient made continued progress with physical therapy.  By postop day 5 patient able to ambulate 300 feet.  Mental status at baseline.  Pain well controlled.  Patient platelets stable and well above baseline.  Patient with elevated creatinine 2.5 on postop day 4, he was given some IV fluids to help with hydration.  This did help with creatinine.  Creatinine stable.  His baseline creatinine is around 1.8.  On postop day 6 patient stable ready for discharge to home with home health PT   Implants: Medacta AMIS standard size 2 cemented stem with 56 mm mPACT TM cup and liner with S metal 28 mm head  He was given perioperative antibiotics:  Anti-infectives (From admission, onward)   Start  Dose/Rate Route Frequency Ordered Stop   08/21/19 1800  ceFAZolin (ANCEF) IVPB 2g/100 mL premix     2 g 200 mL/hr over 30 Minutes Intravenous Every 6 hours 08/21/19 1706 08/22/19 0042   08/21/19 0930  ceFAZolin (ANCEF) IVPB 2g/100 mL premix     2 g 200 mL/hr over 30 Minutes Intravenous On call to O.R. 08/21/19 0919 08/21/19 1225    .  He was given sequential compression devices, early ambulation, and TEDs for DVT prophylaxis.  He benefited maximally from the hospital stay and there were no complications.    Recent vital signs:   Vitals:   08/23/19 2323 08/24/19 0802  BP: 121/66 130/64  Pulse: (!) 103 99  Resp: 17 20  Temp: 98.3 F (36.8 C) 98.1 F (36.7 C)  SpO2: 94% 95%    Recent laboratory studies:  Lab Results  Component Value Date   HGB 8.8 (L) 08/24/2019   HGB 9.1 (L) 08/23/2019   HGB 9.2 (L) 08/22/2019   Lab Results  Component Value Date   WBC 19.3 (H) 08/24/2019   PLT 123 (L) 08/24/2019   Lab Results  Component Value Date   INR 1.4 (H) 04/06/2019   Lab Results  Component Value Date   NA 133 (L) 08/22/2019   K 4.7 08/22/2019   CL 104 08/22/2019   CO2 22 08/22/2019   BUN 43 (H) 08/22/2019   CREATININE 1.87 (H) 08/22/2019   GLUCOSE 357 (H) 08/22/2019    Discharge Medications:   Allergies as of 08/24/2019      Reactions   Clindamycin Other (See Comments)   Low Platelet    Doxycycline Other (See Comments)   Low Platelet    Triamterene-hctz Other (See Comments)   Low platelets   Vancomycin Other (See Comments)   Low Platelet       Medication List    TAKE these medications   albuterol 108 (90 Base) MCG/ACT inhaler Commonly known as: VENTOLIN HFA Inhale 2 puffs into the lungs every 4 (four) hours as needed for wheezing or shortness of breath (wheezing, cough, shortness of breath).   Basaglar KwikPen 100 UNIT/ML Inject 15-40 Units into the skin See admin instructions. Inject 40 units subcutaneously in the morning & inject 15 units subcutaneously in the evening   bifidobacterium infantis capsule Take 1 capsule by mouth 2 (two) times daily.   chlorhexidine 4 % external liquid Commonly known as: HIBICLENS Apply 1 application topically daily.   ciprofloxacin 500 MG tablet Commonly known as: CIPRO Take 500 mg by mouth 2 (two) times daily.   [employer] Vitamin C 500 MG tablet Generic drug: ascorbic acid Take 500 mg by mouth daily.   ferrous sulfate 325 (65 FE) MG tablet Take 325 mg by mouth in the morning and at bedtime.   FOCUSED MIND PO Take 2 tablets by mouth in the  morning and at bedtime. Focus Factor   glimepiride 2 MG tablet Commonly known as: AMARYL Take 2-4 mg by mouth See admin instructions. Take  2 tablets (4 mg) by mouth in the morning and take 1 tablet (2 mg) by mouth in the evening   Klor-Con M20 20 MEQ tablet Generic drug: potassium chloride SA Take 20 mEq by mouth 2 (two) times daily.   lactulose (encephalopathy) 10 GM/15ML Soln Commonly known as: CHRONULAC Take 20 g by mouth daily as needed.   loratadine 10 MG tablet Commonly known as: CLARITIN Take 10 mg by mouth every morning.   magnesium oxide 400 MG tablet  Commonly known as: MAG-OX Take 400 mg by mouth 2 (two) times daily.   methocarbamol 500 MG tablet Commonly known as: ROBAXIN Take 1 tablet (500 mg total) by mouth every 6 (six) hours as needed for muscle spasms.   metolazone 2.5 MG tablet Commonly known as: ZAROXOLYN Take 2.5 mg by mouth See admin instructions. Take 1 tablet (2.5 mg) by mouth daily if needed for fluid retention, max of 2 tablets per week   mupirocin cream 2 % Commonly known as: BACTROBAN Apply 1 application topically daily as needed (applied to nose as needed).   ONE TOUCH ULTRA TEST test strip Generic drug: glucose blood USE THREE TIMES DAILY   OneTouch Delica Lancets 86P Misc AS DIRECTED   oxyCODONE 5 MG immediate release tablet Commonly known as: Oxy IR/ROXICODONE Take 1 tablet (5 mg total) by mouth every 4 (four) hours as needed for moderate pain or severe pain.   spironolactone 25 MG tablet Commonly known as: ALDACTONE Take 25 mg by mouth daily.   torsemide 20 MG tablet Commonly known as: DEMADEX Take 20 mg by mouth in the morning and at bedtime. 1.5 tab daily   triamcinolone cream 0.1 % Commonly known as: KENALOG Apply 1 application topically 2 (two) times daily as needed (skin irritation).   TURMERIC PO Take 1,000 mg by mouth 2 (two) times daily.   vitamin B-12 1000 MCG tablet Commonly known as: CYANOCOBALAMIN Take 1,000 mcg  by mouth in the morning and at bedtime.   Vitamin D3 50 MCG (2000 UT) Chew Chew 4,000 Units by mouth daily.            Durable Medical Equipment  (From admission, onward)         Start     Ordered   08/21/19 1706  DME Walker rolling  Once    Question Answer Comment  Walker: With 5 Inch Wheels   Patient needs a walker to treat with the following condition Status post total hip replacement, left      08/21/19 1706   08/21/19 1706  DME 3 n 1  Once     08/21/19 1706   08/21/19 1706  DME Bedside commode  Once    Question:  Patient needs a bedside commode to treat with the following condition  Answer:  Status post total hip replacement, left   08/21/19 1706          Diagnostic Studies: MR SHOULDER LEFT WO CONTRAST  Result Date: 08/02/2019 CLINICAL DATA:  Fall 2 months ago. Painful and limited range of motion of the left shoulder with occasional popping. EXAM: MRI OF THE LEFT SHOULDER WITHOUT CONTRAST TECHNIQUE: Multiplanar, multisequence MR imaging of the shoulder was performed. No intravenous contrast was administered. COMPARISON:  None. FINDINGS: Despite efforts by the technologist and patient, motion artifact is present on today's exam and could not be eliminated. This reduces exam sensitivity and specificity. Rotator cuff: Moderate supraspinatus and mild subscapularis and infraspinatus tendinopathy with partial thickness articular surface tearing along the supraspinatus but no full-thickness tear identified. Muscles: 2.3 by 0.9 by 0.8 cm (volume = 0.9 cm^3) fluid signal intensity lesion along the myotendinous junction of the subscapularis muscle favoring ganglion cyst. Teres minor atrophy. Biceps long head: Moderate tendinopathy of the intra-articular segment. Acromioclavicular Joint: Moderate spurring and subcortical marrow edema. Type II acromion. Trace subacromial subdeltoid bursitis. Glenohumeral Joint: Moderate degenerative chondral thinning. Labrum: Degenerated posterosuperior  labrum without old defined surface tear. 0.9 by 0.5 by 0.6 cm fluid signal intensity collection  adjacent to the inferior labrum favoring a small paralabral cyst over ganglion cyst. This may imply an occult tear of the inferior labrum. Bones: No significant extra-articular osseous abnormalities identified. Other: No supplemental non-categorized findings. IMPRESSION: 1. Moderate supraspinatus and mild subscapularis and infraspinatus tendinopathy with partial thickness articular surface tearing along the supraspinatus but no full-thickness rotator cuff tear identified. 2. Moderate biceps tendinopathy. 3. Small (approximately 1 cubic cm) ganglion cyst along the myotendinous junction of the subscapularis muscle. 4. Moderate degenerative joint arthropathy and moderate degenerative glenohumeral arthropathy. 5. Degenerated posterosuperior labrum without a old defined surface tear. 6. Small fluid signal intensity collection adjacent to the inferior labrum favoring a small paralabral cyst over ganglion cyst. This may simply simply be an occult tear of the inferior labrum. 7. 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 8. Despite efforts by the technologist and patient, motion artifact is present on today's exam and could not be eliminated. This reduces exam sensitivity and specificity. Electronically Signed   By: Van Clines M.D.   On: 08/02/2019 10:43   DG HIP OPERATIVE UNILAT W OR W/O PELVIS LEFT  Result Date: 08/21/2019 CLINICAL DATA:  Left hip replacement EXAM: OPERATIVE LEFT HIP WITH PELVIS COMPARISON:  None. FLUOROSCOPY TIME:  Radiation Exposure Index (as provided by the fluoroscopic device): 12.1 mGy If the device does not provide the exposure index: Fluoroscopy Time:  30 seconds Number of Acquired Images:  7 FINDINGS: Initial images demonstrate degenerative change of the left hip joint. Subsequent left hip prosthesis is noted in satisfactory position. No acute bony or  soft tissue abnormality is noted. IMPRESSION: Left hip replacement Electronically Signed   By: Inez Catalina M.D.   On: 08/21/2019 14:41   DG HIP UNILAT W OR W/O PELVIS 2-3 VIEWS LEFT  Result Date: 08/21/2019 CLINICAL DATA:  Left hip replacement EXAM: DG HIP (WITH OR WITHOUT PELVIS) 2-3V LEFT COMPARISON:  None. FINDINGS: Interval left hip arthroplasty. Normal alignment. Postsurgical changes in the surrounding soft tissues. IMPRESSION: Interval left hip arthroplasty. Electronically Signed   By: Kathreen Devoid   On: 08/21/2019 15:39    Disposition:     Follow-up Information    Duanne Guess, PA-C Follow up in 2 week(s).   Specialties: Orthopedic Surgery, Emergency Medicine Contact information: [place] [id number] [phone]           ANTERIOR APPROACH TOTAL HIP REPLACEMENT POSTOPERATIVE DIRECTIONS   Hip Rehabilitation, Guidelines Following Surgery  The results of a hip operation are greatly improved after range of motion and muscle strengthening exercises. Follow all safety measures which are given to protect your hip. If any of these exercises cause increased pain or swelling in your joint, decrease the amount until you are comfortable again. Then slowly increase the exercises. Call your caregiver if you have problems or questions.   HOME CARE INSTRUCTIONS  Remove items at home which could result in a fall. This includes throw rugs or furniture in walking pathways.   ICE to the affected hip every three hours for 30 minutes at a time and then as needed for pain and swelling.  Continue to use ice on the hip for pain and swelling from surgery. You may notice swelling that will progress down to the foot and ankle.  This is normal after surgery.  Elevate the leg when you are not up walking on it.    Continue to use the breathing machine which will help keep your temperature down.  It  is common for your temperature to cycle up and down following surgery, especially  at night when you are not up moving around and exerting yourself.  The breathing machine keeps your lungs expanded and your temperature down.  Do not place pillow under knee, focus on keeping the knee straight while resting  DIET You may resume your previous home diet once your are discharged from the hospital.  DRESSING / WOUND CARE / SHOWERING Please remove provena negative pressure dressing on 09/01/2019 and apply honey comb dressing. Keep dressing clean and dry at all times.  Keep blisters covered with dry sterile dressings, change daily as needed.  If blisters rupture, cover with Vaseline gauze and dry sterile dressing daily.  ACTIVITY Walk with your walker as instructed. Use walker as long as suggested by your caregivers. Avoid periods of inactivity such as sitting longer than an hour when not asleep. This helps prevent blood clots.  You may resume a sexual relationship in one month or when given the OK by your doctor.  You may return to work once you are cleared by your doctor.  Do not drive a car for 6 weeks or until released by you surgeon.  Do not drive while taking narcotics.  WEIGHT BEARING Weight bearing as tolerated. Use walker/cane as needed for at least 4 weeks post op.  POSTOPERATIVE CONSTIPATION PROTOCOL Constipation - defined medically as fewer than three stools per week and severe constipation as less than one stool per week.  One of the most common issues patients have following surgery is constipation.  Even if you have a regular bowel pattern at home, your normal regimen is likely to be disrupted due to multiple reasons following surgery.  Combination of anesthesia, postoperative narcotics, change in appetite and fluid intake all can affect your bowels.  In order to avoid complications following surgery, here are some recommendations in order to help you during your recovery period.  Colace (docusate) - Pick up an over-the-counter form of Colace or another stool  softener and take twice a day as long as you are requiring postoperative pain medications.  Take with a full glass of water daily.  If you experience loose stools or diarrhea, hold the colace until you stool forms back up.  If your symptoms do not get better within 1 week or if they get worse, check with your doctor.  Dulcolax (bisacodyl) - Pick up over-the-counter and take as directed by the product packaging as needed to assist with the movement of your bowels.  Take with a full glass of water.  Use this product as needed if not relieved by Colace only.   MiraLax (polyethylene glycol) - Pick up over-the-counter to have on hand.  MiraLax is a solution that will increase the amount of water in your bowels to assist with bowel movements.  Take as directed and can mix with a glass of water, juice, soda, coffee, or tea.  Take if you go more than two days without a movement. Do not use MiraLax more than once per day. Call your doctor if you are still constipated or irregular after using this medication for 7 days in a row.  If you continue to have problems with postoperative constipation, please contact the office for further assistance and recommendations.  If you experience "the worst abdominal pain ever" or develop nausea or vomiting, please contact the office immediatly for further recommendations for treatment.  ITCHING  If you experience itching with your medications, try taking only a single  pain pill, or even half a pain pill at a time.  You can also use Benadryl over the counter for itching or also to help with sleep.   TED HOSE STOCKINGS Wear the elastic stockings on both legs for six weeks following surgery during the day but you may remove then at night for sleeping.  MEDICATIONS See your medication summary on the "After Visit Summary" that the nursing staff will review with you prior to discharge.  You may have some home medications which will be placed on hold until you complete the course  of blood thinner medication.  It is important for you to complete the blood thinner medication as prescribed by your surgeon.  Continue your approved medications as instructed at time of discharge.  PRECAUTIONS If you experience chest pain or shortness of breath - call 911 immediately for transfer to the hospital emergency department.  If you develop a fever greater that 101 F, purulent drainage from wound, increased redness or drainage from wound, foul odor from the wound/dressing, or calf pain - CONTACT YOUR SURGEON.                                                   FOLLOW-UP APPOINTMENTS Make sure you keep all of your appointments after your operation with your surgeon and caregivers. You should call the office at the above phone number and make an appointment for approximately two weeks after the date of your surgery or on the date instructed by your surgeon outlined in the "After Visit Summary".  RANGE OF MOTION AND STRENGTHENING EXERCISES  These exercises are designed to help you keep full movement of your hip joint. Follow your caregiver's or physical therapist's instructions. Perform all exercises about fifteen times, three times per day or as directed. Exercise both hips, even if you have had only one joint replacement. These exercises can be done on a training (exercise) mat, on the floor, on a table or on a bed. Use whatever works the best and is most comfortable for you. Use music or television while you are exercising so that the exercises are a pleasant break in your day. This will make your life better with the exercises acting as a break in routine you can look forward to.  Lying on your back, slowly slide your foot toward your buttocks, raising your knee up off the floor. Then slowly slide your foot back down until your leg is straight again.  Lying on your back spread your legs as far apart as you can without causing discomfort.  Lying on your side, raise your upper leg and foot  straight up from the floor as far as is comfortable. Slowly lower the leg and repeat.  Lying on your back, tighten up the muscle in the front of your thigh (quadriceps muscles). You can do this by keeping your leg straight and trying to raise your heel off the floor. This helps strengthen the largest muscle supporting your knee.  Lying on your back, tighten up the muscles of your buttocks both with the legs straight and with the knee bent at a comfortable angle while keeping your heel on the floor.   IF YOU ARE TRANSFERRED TO A SKILLED REHAB FACILITY If the patient is transferred to a skilled rehab facility following release from the hospital, a list of the current medications  will be sent to the facility for the patient to continue.  When discharged from the skilled rehab facility, please have the facility set up the patient's [place] prior to being released. Also, the skilled facility will be responsible for providing the patient with their medications at time of release from the facility to include their pain medication, the muscle relaxants, and their blood thinner medication. If the patient is still at the rehab facility at time of the two week follow up appointment, the skilled rehab facility will also need to assist the patient in arranging follow up appointment in our office and any transportation needs.  MAKE SURE YOU:  Understand these instructions.  Get help right away if you are not doing well or get worse.    Pick up stool softner and laxative for home use following surgery while on pain medications. Continue to use ice for pain and swelling after surgery. Do not use any lotions or creams on the incision until instructed by your surgeon.    Signed: Dorise Hiss CHRISTOPHER 08/24/2019, 8:42 AM

## 2019-08-24 NOTE — NC FL2 (Signed)
[place] LEVEL OF CARE SCREENING TOOL     IDENTIFICATION  Patient Name: Nathan Schultz Birthdate: 04-07-1956 Sex: male Admission Date (Current Location): 08/21/2019  [place] and [place] Number:  Engineering geologist and Address:  [facility], [address] [place], [place] [address]      Provider Number: [id number]  Attending Physician Name and Address:  Hessie Knows, MD  Relative Name and Phone Number:  [place] Plan    Current Level of Care: Hospital Recommended Level of Care: [place] Prior Approval Number:    Date Approved/Denied:   PASRR Number: [id number]  Discharge Plan: SNF    Current Diagnoses: Patient Active Problem List   Diagnosis Date Noted  . Status post total hip replacement, left 08/21/2019  . Cellulitis of left lower extremity 08/14/2017  . Abscess of right leg   . Cellulitis of right lower extremity 07/05/2017  . OSA on CPAP 01/25/2017  . History of ITP 05/29/2015  . Controlled type 2 diabetes mellitus without complication, without long-term current use of insulin ([place]) 05/12/2015  . Hepatic encephalopathy ([place]) 10/02/2013  . Non-alcoholic micronodular cirrhosis of liver ([place]) 10/02/2013    Orientation RESPIRATION BLADDER Height & Weight     Self, Time, Situation, Place  Normal Continent Weight: 103.9 kg Height:  5' 9"  (175.3 cm)  BEHAVIORAL SYMPTOMS/MOOD NEUROLOGICAL BOWEL NUTRITION STATUS      Continent    AMBULATORY STATUS COMMUNICATION OF NEEDS Skin   Extensive Assist Verbally Surgical wounds                       Personal Care Assistance Level of Assistance  Dressing, Bathing Bathing Assistance: Limited assistance   Dressing Assistance: Limited assistance     Functional Limitations Info             SPECIAL CARE FACTORS FREQUENCY  PT (By licensed PT), OT (By licensed OT)     PT Frequency: 5 times per week OT Frequency: 5 times per week             Contractures Contractures Info: Not present    Additional Factors Info  Allergies   Allergies Info: Clindamycin, Doxycycline, Triamterene-hctz, Vancomycin           Current Medications (08/24/2019):  This is the current hospital active medication list Current Facility-Administered Medications  Medication Dose Route Frequency Provider Last Rate Last Admin  . 0.9 %  sodium chloride infusion   Intravenous Continuous Hessie Knows, MD 75 mL/hr at 08/22/19 0400 Rate Verify at 08/22/19 0400  . albuterol (PROVENTIL) (2.5 MG/3ML) 0.083% nebulizer solution 2.5 mg  2.5 mg Inhalation Q4H PRN Hessie Knows, MD      . alum & mag hydroxide-simeth (MAALOX/MYLANTA) 200-200-20 MG/5ML suspension 30 mL  30 mL Oral Q4H PRN Hessie Knows, MD      . ascorbic acid (VITAMIN C) tablet 500 mg  500 mg Oral Daily Hessie Knows, MD   500 mg at 08/24/19 1100  . bisacodyl (DULCOLAX) suppository 10 mg  10 mg Rectal Daily PRN Hessie Knows, MD      . cholecalciferol (VITAMIN D) tablet 4,000 Units  4,000 Units Oral Daily Hessie Knows, MD   4,000 Units at 08/24/19 1101  . diphenhydrAMINE (BENADRYL) 12.5 MG/5ML elixir 12.5-25 mg  12.5-25 mg Oral Q4H PRN Hessie Knows, MD      . docusate sodium (COLACE) capsule 100 mg  100 mg Oral BID Hessie Knows, MD   100 mg  at 08/24/19 1100  . ferrous sulfate tablet 325 mg  325 mg Oral BID WC Hessie Knows, MD   325 mg at 08/24/19 0810  . glimepiride (AMARYL) tablet 2 mg  2 mg Oral Daily Hessie Knows, MD   2 mg at 08/23/19 1803  . glimepiride (AMARYL) tablet 4 mg  4 mg Oral Daily Hessie Knows, MD   4 mg at 08/24/19 0810  . influenza vaccine adjuvanted (FLUAD) injection 0.5 mL  0.5 mL Intramuscular Tomorrow-1000 Hessie Knows, MD      . insulin aspart (novoLOG) injection 0-15 Units  0-15 Units Subcutaneous TID WC Hessie Knows, MD   8 Units at 08/24/19 1254  . insulin glargine (LANTUS) injection 15 Units  15 Units Subcutaneous QHS Hessie Knows, MD   15 Units at 08/23/19 2126  .  insulin glargine (LANTUS) injection 40 Units  40 Units Subcutaneous Daily Hessie Knows, MD   40 Units at 08/24/19 1106  . lactulose (CHRONULAC) 10 GM/15ML solution 20 g  20 g Oral Daily Hessie Knows, MD   20 g at 08/24/19 1101  . loratadine (CLARITIN) tablet 10 mg  10 mg Oral Claudine Mouton, Legrand Como, MD   10 mg at 08/24/19 1100  . magnesium citrate solution 1 Bottle  1 Bottle Oral Once PRN Hessie Knows, MD      . magnesium hydroxide (MILK OF MAGNESIA) suspension 30 mL  30 mL Oral Daily PRN Hessie Knows, MD   30 mL at 08/22/19 1514  . magnesium oxide (MAG-OX) tablet 400 mg  400 mg Oral BID Hessie Knows, MD   400 mg at 08/24/19 1100  . menthol-cetylpyridinium (CEPACOL) lozenge 3 mg  1 lozenge Oral PRN Hessie Knows, MD       Or  . phenol (CHLORASEPTIC) mouth spray 1 spray  1 spray Mouth/Throat PRN Hessie Knows, MD      . methocarbamol (ROBAXIN) tablet 500 mg  500 mg Oral Q6H PRN Hessie Knows, MD       Or  . methocarbamol (ROBAXIN) 500 mg in dextrose 5 % 50 mL IVPB  500 mg Intravenous Q6H PRN Hessie Knows, MD      . metoCLOPramide (REGLAN) tablet 5-10 mg  5-10 mg Oral Q8H PRN Hessie Knows, MD       Or  . metoCLOPramide (REGLAN) injection 5-10 mg  5-10 mg Intravenous Q8H PRN Hessie Knows, MD      . metolazone (ZAROXOLYN) tablet 2.5 mg  2.5 mg Oral Daily PRN Hessie Knows, MD      . morphine 2 MG/ML injection 0.5 mg  0.5 mg Intravenous Q2H PRN Hessie Knows, MD      . ondansetron [facility]) tablet 4 mg  4 mg Oral Q6H PRN Hessie Knows, MD       Or  . ondansetron [facility]) injection 4 mg  4 mg Intravenous Q6H PRN Hessie Knows, MD   4 mg at 08/22/19 1040  . oxyCODONE (Oxy IR/ROXICODONE) immediate release tablet 5 mg  5 mg Oral Q4H PRN Hessie Knows, MD      . pantoprazole (PROTONIX) EC tablet 40 mg  40 mg Oral Daily Hessie Knows, MD   40 mg at 08/24/19 1100  . pneumococcal 23 valent vaccine (PNEUMOVAX-23) injection 0.5 mL  0.5 mL Intramuscular Tomorrow-1000 Hessie Knows, MD      . potassium  chloride SA (KLOR-CON) CR tablet 20 mEq  20 mEq Oral BID Hessie Knows, MD   20 mEq at 08/24/19 1100  . spironolactone (ALDACTONE) tablet 25 mg  25 mg Oral Daily Hessie Knows, MD   25 mg at 08/24/19 1105  . torsemide (DEMADEX) tablet 20 mg  20 mg Oral BID Hessie Knows, MD   20 mg at 08/24/19 0810  . vitamin B-12 (CYANOCOBALAMIN) tablet 1,000 mcg  1,000 mcg Oral Daily Hessie Knows, MD   1,000 mcg at 08/24/19 1100     Discharge Medications: Please see discharge summary for a list of discharge medications.  Relevant Imaging Results:  Relevant Lab Results:   Additional Information SS# [id number]  Su Hilt, RN

## 2019-08-24 NOTE — Discharge Instructions (Signed)

## 2019-08-24 NOTE — Progress Notes (Addendum)
Subjective: 3 Days Post-Op Procedure(s) (LRB): LEFT TOTAL HIP ARTHROPLASTY ANTERIOR APPROACH (Left) Patient reports pain as 7 on 0-10 scale.   Patient is well, and has had no acute complaints or problems Denies any CP, SOB, ABD pain, N/V We will continue with physical therapy today.  Plan is to go Home after hospital stay.  Objective: Vital signs in last 24 hours: Temp:  [98.1 F (36.7 C)-98.7 F (37.1 C)] 98.1 F (36.7 C) (03/26 0802) Pulse Rate:  [99-110] 99 (03/26 0802) Resp:  [17-20] 20 (03/26 0802) BP: (121-130)/(64-70) 130/64 (03/26 0802) SpO2:  [92 %-95 %] 95 % (03/26 0802)  Intake/Output from previous day: 03/25 0701 - 03/26 0700 In: -  Out: 1450 [Urine:1450] Intake/Output this shift: No intake/output data recorded.  Recent Labs    08/21/19 1359 08/21/19 1744 08/22/19 0606 08/23/19 0535 08/24/19 0621  HGB 11.7* 10.4* 9.2* 9.1* 8.8*   Recent Labs    08/23/19 0535 08/24/19 0621  WBC 23.0* 19.3*  RBC 2.65* 2.52*  HCT 24.6* 23.5*  PLT 137* 123*   Recent Labs    08/22/19 0606  NA 133*  K 4.7  CL 104  CO2 22  BUN 43*  CREATININE 1.87*  GLUCOSE 357*  CALCIUM 7.7*   No results for input(s): LABPT, INR in the last 72 hours.  EXAM General - Patient is Alert, Appropriate and Oriented Extremity - Neurovascular intact Sensation intact distally Intact pulses distally Dorsiflexion/Plantar flexion intact No cellulitis present Compartment soft Dressing - dressing C/D/I, Praveena intact with scant drainage in canister, 25 cc.  One large blister and one small blister present along tecaderm. No open wounds.  Blisters covered with ABD pad and paper tape.  We will continue to monitor. Motor Function - intact, moving foot and toes well on exam.   Past Medical History:  Diagnosis Date  . Anemia   . Aortic stenosis    moderate  . Arthritis   . Bronchitis    has prn inhaler  . Cancer ([place])    skin cancer  . Cellulitis    legs feet and hand  .  Chronic kidney disease    STAGE 3  . Diabetes mellitus without complication ([place])   . Headache    H/O CLUSTER HA'S  . History of ITP   . History of methicillin resistant staphylococcus aureus (MRSA)   . Liver cirrhosis secondary to nonalcoholic steatohepatitis (NASH) ([place])   . OSA (obstructive sleep apnea)    no CPAP couldn't tolerate  . Seasonal allergies   . UTI (urinary tract infection) 08/05/2019   TAKING CIPRO     Assessment/Plan:   3 Days Post-Op Procedure(s) (LRB): LEFT TOTAL HIP ARTHROPLASTY ANTERIOR APPROACH (Left) Active Problems:   Status post total hip replacement, left    Estimated body mass index is 33.82 kg/m as calculated from the following:   Height as of this encounter: 5' 9"  (1.753 m).   Weight as of this encounter: 103.9 kg. Advance diet Up with therapy  Doing well, pain controlled.  Patient seems to be at baseline after discussing symptoms with wife.  Seems to be improving after discontinuing tramadol and hydrocodone. Vital signs are stable Hepatic encephalopathy - ammonia 81. Stable. Previous ammonia levels 90-200. Receiving lactulose daily. DM - continue with sliding scale insulin Acute on chronic renal insufficiency - Will give IV fluids and recheck labs in the am. Care manager to assist with discharge to home with home health PT, likely Saturday.  DVT Prophylaxis - TED hose  and SCDs Weight-Bearing as tolerated to left leg   T. Rachelle Hora, PA-C [place] 08/24/2019, 8:19 AM

## 2019-08-24 NOTE — Progress Notes (Signed)
Physical Therapy Treatment Patient Details Name: Nathan Schultz MRN: [id number] DOB: 16-Feb-1956 Today's Date: 08/24/2019    History of Present Illness Pt is a 64 year old male s/p elective L total hip replacement.  Pt's PMH includes arthritis, DM, ITP, and chronic kidney disease    PT Comments    Pt was lethargic and reported being drowsy t/o the session- nsg aware. Pt did not require physical assistance with bed mobility or with standing up, but ambulation was limited to only one or two steps to the [facility]. Given that pt remains limited due to lethargy and drowsiness, functional status still has not been fully assessed. Pt will be followed closely to see if HHPT becomes more appropriate than SNF as pt's medical impairments resolve. Pt will benefit from PT services in a SNF setting upon discharge to safely address deficits listed in patient problem list for decreased caregiver assistance and eventual return to PLOF.     Follow Up Recommendations  SNF     Equipment Recommendations  None recommended by PT    Recommendations for Other Services       Precautions / Restrictions Precautions Precautions: Anterior Hip Precaution Booklet Issued: Yes (comment) Restrictions Weight Bearing Restrictions: Yes LLE Weight Bearing: Weight bearing as tolerated    Mobility  Bed Mobility Overal bed mobility: Needs Assistance Bed Mobility: Supine to Sit     Supine to sit: Supervision     General bed mobility comments: For supine to sit, extra time and effort required.  Transfers Overall transfer level: Needs assistance Equipment used: Rolling walker (2 wheeled) Transfers: Sit to/from Stand Sit to Stand: +2 safety/equipment;Min guard         General transfer comment: +2 was strictly for safety given pt was lethargic and previous instances of being unresponsive during/after mobility  Ambulation/Gait Ambulation/Gait assistance: Min guard Gait Distance (Feet): 2 Feet Assistive device:  Rolling walker (2 wheeled)       General Gait Details: Very small steps from EOB to BSC. +2 was strictly for safety given pt was lethargic and previous instances of being unresponsive during/after mobility   Stairs             Wheelchair Mobility    Modified Rankin (Stroke Patients Only)       Balance Overall balance assessment: Needs assistance Sitting-balance support: No upper extremity supported;Feet unsupported Sitting balance-Leahy Scale: Fair Sitting balance - Comments: Pt occasionaly required cueing to maintain an upright sitting posture. Pt had a tendancy to lean to one side which appeared to be related to his lethragy. Postural control: Right lateral lean Standing balance support: Bilateral upper extremity supported;During functional activity Standing balance-Leahy Scale: Fair Standing balance comment: able to stand and take a few small steps with light-mod BUE assist through RW                            Cognition Arousal/Alertness: Lethargic Behavior During Therapy: [facility] for tasks assessed/performed Overall Cognitive Status: Difficult to assess                                        Exercises Total Joint Exercises Ankle Circles/Pumps: AROM;Strengthening;Both;10 reps Quad Sets: Strengthening;Both;10 reps Gluteal Sets: Strengthening;Both;10 reps Towel Squeeze: Strengthening;Both;10 reps Hip ABduction/ADduction: AAROM;Both;10 reps Straight Leg Raises: AAROM;Both;10 reps Long Arc Quad: AROM;Strengthening;Both;10 Theatre manager in Standing: AROM;Strengthening;Both;10 reps Other Exercises Other Exercises:  exercise packet reviewed    General Comments        Pertinent Vitals/Pain Pain Assessment: 0-10 Pain Score: 7  Pain Location: L hip Pain Descriptors / Indicators: Discomfort;Grimacing;Guarding;Aching;Sore Pain Intervention(s): Monitored during session;Premedicated before session    Home Living                       Prior Function            PT Goals (current goals can now be found in the care plan section) Progress towards PT goals: Progressing toward goals    Frequency    BID      PT Plan Current plan remains appropriate    Co-evaluation              AM-PAC PT "6 Clicks" Mobility   Outcome Measure  Help needed turning from your back to your side while in a flat bed without using bedrails?: A Little Help needed moving from lying on your back to sitting on the side of a flat bed without using bedrails?: A Little Help needed moving to and from a bed to a chair (including a wheelchair)?: A Little Help needed standing up from a chair using your arms (e.g., wheelchair or bedside chair)?: A Little Help needed to walk in hospital room?: A Lot Help needed climbing 3-5 steps with a railing? : Total 6 Click Score: 15    End of Session Equipment Utilized During Treatment: Gait belt Activity Tolerance: Patient limited by lethargy Patient left: Other (comment);with nursing/sitter in room(ON [facility] with nsg and nurse tech present) Nurse Communication: Mobility status;Precautions;Weight bearing status;Other (comment)(pt appeared drowsy and lethargic, nsg aware) PT Visit Diagnosis: Unsteadiness on feet (R26.81);Other abnormalities of gait and mobility (R26.89);History of falling (Z91.81);Muscle weakness (generalized) (M62.81);Pain Pain - Right/Left: Left Pain - part of body: Hip     Time: 0076-2263 PT Time Calculation (min) (ACUTE ONLY): 27 min  Charges:                        Annabelle Harman, SPT 08/24/19 12:51 PM

## 2019-08-24 NOTE — Progress Notes (Signed)
Occupational Therapy Treatment Patient Details Name: Nathan Schultz MRN: [id number] DOB: 04-19-1956 Today's Date: 08/24/2019    History of present illness Pt is a 64 year old male s/p elective L total hip replacement.  Pt's PMH includes arthritis, DM, ITP, and chronic kidney disease   OT comments  Pt seen for OT tx this date. Spouse present and actively engaged throughout session. Pt initially lethargic and reporting 7/10 pain but willing to try. Supervision and heavy BUE reliance on bed rails to perform supine>sit EOB. Fair sitting balance. Pt/spouse instructed in AE for LB dressing with verbal instruction and visual demo. Pt able to return demo with occasional cues for sequencing. Min A for ADL transfer from EOB with cues for hand/foot placement to improve positioning prior to transfer attempts. In standing, pt reported becoming clammy and mild lightheadedness which improved with sitting. Min A for LLE mgt back to bed. Pt reporting 7/10 pain at end. Pt demonstrated slow processing skills but able to follow commands. Continues to be somewhat lethargic. Continues to benefit from skilled OT services. Given slow progression, consider HHOT vs SNF pending further progress towards goals prior to discharge.   Follow Up Recommendations  Home health OT;SNF(pending further progress)    Equipment Recommendations  3 in 1 bedside commode    Recommendations for Other Services      Precautions / Restrictions Precautions Precautions: Anterior Hip Precaution Booklet Issued: No Restrictions Weight Bearing Restrictions: Yes LLE Weight Bearing: Weight bearing as tolerated       Mobility Bed Mobility Overal bed mobility: Needs Assistance Bed Mobility: Supine to Sit;Sit to Supine     Supine to sit: Supervision Sit to supine: Min assist   General bed mobility comments: Min A for LLE mgt back to bed; heavy BUE and bed rail use for sup>sit  Transfers Overall transfer level: Needs  assistance Equipment used: Rolling walker (2 wheeled) Transfers: Sit to/from Stand Sit to Stand: Min assist;From elevated surface         General transfer comment: VC for foot and hand placement to improve COM over BOS prior to lift off    Balance Overall balance assessment: Needs assistance Sitting-balance support: No upper extremity supported;Feet supported Sitting balance-Leahy Scale: Fair Sitting balance - Comments: able to sit EOB without LOB while lifting LLE then RLE to initiate donning pants Postural control: Right lateral lean Standing balance support: Bilateral upper extremity supported;During functional activity Standing balance-Leahy Scale: Fair Standing balance comment: able to stand and take a few small steps with light-mod BUE assist through RW                           ADL either performed or assessed with clinical judgement   ADL Overall ADL's : Needs assistance/impaired                     Lower Body Dressing: Minimal assistance;Sit to/from stand;Cueing for sequencing;With adaptive equipment Lower Body Dressing Details (indicate cue type and reason): Pt/spouse instructed in AE for LB dressing w/ verbal instruction and visual demo. Pt able to demo requiring cues for AE, hand/foot placement, and sequencing. Min A for ADL transfer. Pt/spouse instructed in compression stocking mgt.     Toileting- Clothing Manipulation and Hygiene: Supervision/safety;Sitting/lateral lean Toileting - Clothing Manipulation Details (indicate cue type and reason): with set up, pt able to successfully use urinal EOB             Vision Patient Visual  Report: No change from baseline     Perception     Praxis      Cognition Arousal/Alertness: Lethargic Behavior During Therapy: WFL for tasks assessed/performed Overall Cognitive Status: Difficult to assess                                 General Comments: somewhat lethargic, improving with  sitting EOB, slightly delayed processing of information        Exercises Total Joint Exercises Ankle Circles/Pumps: AROM;Strengthening;Both;10 reps Quad Sets: Strengthening;Both;10 reps Gluteal Sets: Strengthening;Both;10 reps Towel Squeeze: Strengthening;Both;10 reps Hip ABduction/ADduction: AAROM;Both;10 reps Straight Leg Raises: AAROM;Both;10 reps Long Arc Quad: AROM;Strengthening;Both;10 Theatre manager in Standing: AROM;Strengthening;Both;10 reps Other Exercises Other Exercises: exercise packet reviewed   Shoulder Instructions       General Comments pt reporting slightly lightheaded and "clammy" after standing ~49mn. Improved with sitting >558m    Pertinent Vitals/ Pain       Pain Assessment: 0-10 Pain Score: 8  Pain Location: L hip at start of session, down to 7/10 at end once back in bed Pain Descriptors / Indicators: Aching;Discomfort;Guarding Pain Intervention(s): Limited activity within patient's tolerance;Monitored during session;Premedicated before session;Repositioned  Home Living                                          Prior Functioning/Environment              Frequency  Min 1X/week        Progress Toward Goals  OT Goals(current goals can now be found in the care plan section)  Progress towards OT goals: Progressing toward goals  Acute Rehab OT Goals Patient Stated Goal: "Walk without getting pale" OT Goal Formulation: With patient Time For Goal Achievement: 09/05/19 Potential to Achieve Goals: Good  Plan Discharge plan remains appropriate;Frequency remains appropriate    Co-evaluation                 AM-PAC OT "6 Clicks" Daily Activity     Outcome Measure   Help from another person eating meals?: None Help from another person taking care of personal grooming?: None Help from another person toileting, which includes using toliet, bedpan, or urinal?: A Little Help from another person bathing (including washing,  rinsing, drying)?: A Little Help from another person to put on and taking off regular upper body clothing?: None Help from another person to put on and taking off regular lower body clothing?: A Little 6 Click Score: 21    End of Session Equipment Utilized During Treatment: Gait belt;Rolling walker  OT Visit Diagnosis: Other abnormalities of gait and mobility (R26.89);Muscle weakness (generalized) (M62.81);Pain Pain - Right/Left: Left Pain - part of body: Hip   Activity Tolerance Patient tolerated treatment well   Patient Left in bed;with call bell/phone within reach;with bed alarm set;with SCD's reapplied;with family/visitor present   Nurse Communication          Time: [id number] Time Calculation (min): 29 min  Charges: OT General Charges $OT Visit: 1 Visit OT Treatments $Self Care/Home Management : 23-37 mins  JaJeni SallesMPH, MS, OTR/L ascom [phone]/26/21, 2:36 PM

## 2019-08-25 LAB — CBC
HCT: 22.8 % — ABNORMAL LOW (ref 39.0–52.0)
Hemoglobin: 8.4 g/dL — ABNORMAL LOW (ref 13.0–17.0)
MCH: 34.9 pg — ABNORMAL HIGH (ref 26.0–34.0)
MCHC: 36.8 g/dL — ABNORMAL HIGH (ref 30.0–36.0)
MCV: 94.6 fL (ref 80.0–100.0)
Platelets: 94 10*3/uL — ABNORMAL LOW (ref 150–400)
RBC: 2.41 MIL/uL — ABNORMAL LOW (ref 4.22–5.81)
RDW: 14 % (ref 11.5–15.5)
WBC: 13.8 10*3/uL — ABNORMAL HIGH (ref 4.0–10.5)
nRBC: 0 % (ref 0.0–0.2)

## 2019-08-25 LAB — BASIC METABOLIC PANEL
Anion gap: 9 (ref 5–15)
BUN: 85 mg/dL — ABNORMAL HIGH (ref 8–23)
CO2: 28 mmol/L (ref 22–32)
Calcium: 8 mg/dL — ABNORMAL LOW (ref 8.9–10.3)
Chloride: 97 mmol/L — ABNORMAL LOW (ref 98–111)
Creatinine, Ser: 2.32 mg/dL — ABNORMAL HIGH (ref 0.61–1.24)
GFR calc Af Amer: 33 mL/min — ABNORMAL LOW (ref >60)
GFR calc non Af Amer: 28 mL/min — ABNORMAL LOW (ref >60)
Glucose, Bld: 133 mg/dL — ABNORMAL HIGH (ref 70–99)
Potassium: 3.9 mmol/L (ref 3.5–5.1)
Sodium: 134 mmol/L — ABNORMAL LOW (ref 135–145)

## 2019-08-25 LAB — GLUCOSE, CAPILLARY
Glucose-Capillary: 120 mg/dL — ABNORMAL HIGH (ref 70–99)
Glucose-Capillary: 174 mg/dL — ABNORMAL HIGH (ref 70–99)
Glucose-Capillary: 218 mg/dL — ABNORMAL HIGH (ref 70–99)
Glucose-Capillary: 188 mg/dL — ABNORMAL HIGH (ref 70–99)

## 2019-08-25 NOTE — Progress Notes (Signed)
Physical Therapy Treatment Patient Details Name: Nathan Schultz MRN: [id number] DOB: February 28, 1956 Today's Date: 08/25/2019    History of Present Illness Pt is a 64 year old male s/p elective L total hip replacement.  Pt's PMH includes arthritis, DM, ITP, and chronic kidney disease    PT Comments    Pt was long sitting in bed upon arriving with spouse at bedside. Pt agrees to PT session and is cooperative however does have cognition deficits present. Pt has flat affect and requires increased time to answer questions. He was alert throughout but disoriented and unaware of deficits. Pt's spouse very supportive and reports slight baseline cognition deficits but not as severe as present. Pt requested to use BR. He required min assist to exit R side of bed. Has incontinence episode while ambulating to BR and then once in BR prior to making it to toilet. Pt requires constant min assist during ambulation for safety. He ambulates with narrow/scissoring gait pattern and poor posture. After cleaning up from incontinence episode, pt did ambulate 100 ft into hallway. He has poor ability to correct gait pattern even with constant cueing. Pt was returned to room and required mod assist to reposition to supine. Lengthy discussion between pt's spouse and therapist about DC disposition. PT highly recommends DC to SNF for safety however pt's spouse wants pt to d/c to home. Pt is a high fall risk and requires assistance with all mobility. He would benefit from continued skilled PT to address balance, strength, and safe functional mobility deficits. Acute PT to continue to follow per POC.    Follow Up Recommendations  SNF     Equipment Recommendations  None recommended by PT    Recommendations for Other Services       Precautions / Restrictions Precautions Precautions: Anterior Hip Precaution Booklet Issued: No Restrictions Weight Bearing Restrictions: Yes LLE Weight Bearing: Weight bearing as tolerated     Mobility  Bed Mobility Overal bed mobility: Needs Assistance Bed Mobility: Supine to Sit;Sit to Supine     Supine to sit: Min assist Sit to supine: Mod assist   General bed mobility comments: min assist to exit bed and mod assist to return after ambulation. Pt very fatigued.  Transfers Overall transfer level: Needs assistance Equipment used: Rolling walker (2 wheeled) Transfers: Sit to/from Stand Sit to Stand: Min assist         General transfer comment: Pt required min assist to stand from EOB and from elevated toilet seat. Vcs for handplacement and technique.   Ambulation/Gait Ambulation/Gait assistance: Min assist Gait Distance (Feet): 100 Feet Assistive device: Rolling walker (2 wheeled) Gait Pattern/deviations: Narrow base of support;Scissoring;Trunk flexed Gait velocity: decreased   General Gait Details: Pt demonstartes poor gait posture and safety. he required constant min assist to prevent LOB. pt has narrow BOS andoccasional scissoring. Constant vcs throughout for correction however no carryover. Pt very fatigued with minimal activity   Stairs             Wheelchair Mobility    Modified Rankin (Stroke Patients Only)       Balance Overall balance assessment: Modified Independent Sitting-balance support: Bilateral upper extremity supported Sitting balance-Leahy Scale: Good     Standing balance support: Bilateral upper extremity supported Standing balance-Leahy Scale: Good Standing balance comment: needs RW                            Cognition Arousal/Alertness: Awake/alert Behavior During Therapy: Flat affect  Overall Cognitive Status: Within Functional Limits for tasks assessed Area of Impairment: Orientation;Attention;Following commands;Safety/judgement;Problem solving                               General Comments: pt was long sitting in bed upon arriving. His spouse is at bedside. Pt has flat effect and continues  to present with cognition deificts limiting progression with PT      Exercises      General Comments        Pertinent Vitals/Pain Pain Assessment: 0-10 Pain Score: 6  Faces Pain Scale: Hurts a little bit Pain Location: L hip Pain Descriptors / Indicators: Aching;Guarding Pain Intervention(s): Limited activity within patient's tolerance;Monitored during session;Repositioned    Home Living                      Prior Function            PT Goals (current goals can now be found in the care plan section) Acute Rehab PT Goals Patient Stated Goal: pt did not express goals and has  Progress towards PT goals: Progressing toward goals    Frequency    BID      PT Plan Current plan remains appropriate    Co-evaluation              AM-PAC PT "6 Clicks" Mobility   Outcome Measure  Help needed turning from your back to your side while in a flat bed without using bedrails?: A Little Help needed moving from lying on your back to sitting on the side of a flat bed without using bedrails?: A Little Help needed moving to and from a bed to a chair (including a wheelchair)?: A Little Help needed standing up from a chair using your arms (e.g., wheelchair or bedside chair)?: A Little Help needed to walk in hospital room?: A Little Help needed climbing 3-5 steps with a railing? : A Lot 6 Click Score: 17    End of Session Equipment Utilized During Treatment: Gait belt Activity Tolerance: Patient limited by fatigue;Other (comment);Patient limited by lethargy(limited by cognition deficits) Patient left: in bed;with call bell/phone within reach;with bed alarm set;with family/visitor present Nurse Communication: Mobility status PT Visit Diagnosis: Unsteadiness on feet (R26.81);Muscle weakness (generalized) (M62.81);Difficulty in walking, not elsewhere classified (R26.2) Pain - Right/Left: Left Pain - part of body: Hip     Time: 4982-6415 PT Time Calculation (min)  (ACUTE ONLY): 27 min  Charges:  $Gait Training: 8-22 mins $Therapeutic Activity: 8-22 mins                     Julaine Fusi PTA 08/25/19, 3:34 PM

## 2019-08-25 NOTE — Progress Notes (Signed)
  Subjective: 4 Days Post-Op Procedure(s) (LRB): LEFT TOTAL HIP ARTHROPLASTY ANTERIOR APPROACH (Left) Patient reports pain as well-controlled.   Patient is well, and has had no acute complaints or problems Plan is to go Home after hospital stay if patient can complete therapy goals. Negative for chest pain and shortness of breath Fever: no Gastrointestinal: negative for nausea and vomiting.  Patient has had a bowel movement.  Objective: Vital signs in last 24 hours: Temp:  [98.1 F (36.7 C)-98.3 F (36.8 C)] 98.1 F (36.7 C) (03/27 0757) Pulse Rate:  [94-102] 94 (03/27 0757) Resp:  [16-17] 16 (03/27 0757) BP: (120-136)/(56-58) 125/56 (03/27 0757) SpO2:  [95 %-99 %] 95 % (03/27 0757)  Intake/Output from previous day:  Intake/Output Summary (Last 24 hours) at 08/25/2019 1050 Last data filed at 08/25/2019 1019 Gross per 24 hour  Intake 781.48 ml  Output 1300 ml  Net -518.52 ml    Intake/Output this shift: Total I/O In: 281.5 [P.O.:240; I.V.:41.5] Out: -   Labs: Recent Labs    08/23/19 0535 08/24/19 0621 08/25/19 0528  HGB 9.1* 8.8* 8.4*   Recent Labs    08/24/19 0621 08/25/19 0528  WBC 19.3* 13.8*  RBC 2.52* 2.41*  HCT 23.5* 22.8*  PLT 123* 94*   Recent Labs    08/24/19 1750 08/25/19 0528  NA 129* 134*  K 3.9 3.9  CL 93* 97*  CO2 27 28  BUN 86* 85*  CREATININE 2.50* 2.32*  GLUCOSE 264* 133*  CALCIUM 8.3* 8.0*   No results for input(s): LABPT, INR in the last 72 hours.   EXAM General - Patient is Alert, Appropriate and slightly lethargic Extremity - Neurovascular intact Dorsiflexion/Plantar flexion intact Compartment soft Dressing/Incision -Praveena dressing intact. Upon removing paper tape to check blisters, blisters ruptured. Motor Function - intact, moving foot and toes well on exam.  Cardiovascular- regular rate, systolic heart murmur noted  Respiratory- Lungs clear to auscultation bilaterally    Assessment/Plan: 4 Days Post-Op  Procedure(s) (LRB): LEFT TOTAL HIP ARTHROPLASTY ANTERIOR APPROACH (Left) Active Problems:   Status post total hip replacement, left  Estimated body mass index is 33.82 kg/m as calculated from the following:   Height as of this encounter: 5' 9"  (1.753 m).   Weight as of this encounter: 103.9 kg. Advance diet Up with therapy  Fresh ABD applied over ruptured blisters, secured with paper tape.   DVT Prophylaxis - Ted hose and SCDs Weight-Bearing as tolerated to left leg  Cassell Smiles, PA-C [facility] Orthopaedic Surgery 08/25/2019, 10:50 AM

## 2019-08-25 NOTE — Progress Notes (Signed)
Physical Therapy Evaluation Patient Details Name: Nathan Schultz MRN: [id number] DOB: 1955/12/14 Today's Date: 08/25/2019   History of Present Illness  Pt is a 64 year old male s/p elective L total hip replacement.  Pt's PMH includes arthritis, DM, ITP, and chronic kidney disease  Clinical Impression  Patient agrees to PT treatment. He needs min assist for supine to sit bed mobility. He takes a few minutes during sitting before he is ready to transfer sit to stand with RW. He has wound vac and it is attached to RW. He performs sit to stand transfer from edge of bed to standing with RW and min assist with elevated bed height to assist. He is able to ambulate 75 feet today with RW , with decreased gait speed and step- to gait  Pattern. He performs stand to sit transfer to recliner with the last foot uncontrolled sit/plop to the recliner chair. He requests not to have LE's elevated in recliner as he might use the commode soon. His wife enters the room and reports that she can get him all covered up and tray back in place. He reports no pain during treatment. He will benefit from skilled PT to improve mobility, and strength.     Follow Up Recommendations SNF    Equipment Recommendations  None recommended by PT    Recommendations for Other Services       Precautions / Restrictions Precautions Precautions: Anterior Hip Restrictions Weight Bearing Restrictions: Yes LLE Weight Bearing: Weight bearing as tolerated      Mobility  Bed Mobility Overal bed mobility: Modified Independent Bed Mobility: Supine to Sit     Supine to sit: Min guard     General bed mobility comments: (Patient needs min assist for LLE to edge of bed)  Transfers Overall transfer level: Modified independent Equipment used: Rolling walker (2 wheeled) Transfers: Sit to/from Stand Sit to Stand: Min guard         General transfer comment: (vc for safety)  Ambulation/Gait Ambulation/Gait assistance: Herbalist (Feet): (75) Assistive device: Rolling walker (2 wheeled) Gait Pattern/deviations: Step-to pattern        Stairs            Wheelchair Mobility    Modified Rankin (Stroke Patients Only)       Balance Overall balance assessment: Modified Independent Sitting-balance support: Bilateral upper extremity supported Sitting balance-Leahy Scale: Good     Standing balance support: Bilateral upper extremity supported Standing balance-Leahy Scale: Good Standing balance comment: needs RW                             Pertinent Vitals/Pain Pain Assessment: No/denies pain    Home Living                        Prior Function                 Hand Dominance        Extremity/Trunk Assessment                Communication      Cognition Arousal/Alertness: Awake/alert Behavior During Therapy: Flat affect Overall Cognitive Status: Within Functional Limits for tasks assessed                                 General Comments: (Pt is slow to answer,  slow to respond to mobility tasks)      General Comments      Exercises     Assessment/Plan    PT Assessment    PT Problem List         PT Treatment Interventions      PT Goals (Current goals can be found in the Care Plan section)       Frequency BID   Barriers to discharge        Co-evaluation               AM-PAC PT "6 Clicks" Mobility  Outcome Measure Help needed turning from your back to your side while in a flat bed without using bedrails?: A Little Help needed moving from lying on your back to sitting on the side of a flat bed without using bedrails?: A Little Help needed moving to and from a bed to a chair (including a wheelchair)?: A Little Help needed standing up from a chair using your arms (e.g., wheelchair or bedside chair)?: A Little Help needed to walk in hospital room?: A Little Help needed climbing 3-5 steps with a  railing? : A Lot 6 Click Score: 17    End of Session Equipment Utilized During Treatment: Gait belt Activity Tolerance: Patient limited by fatigue Patient left: in chair;with family/visitor present Nurse Communication: Mobility status PT Visit Diagnosis: Unsteadiness on feet (R26.81);Muscle weakness (generalized) (M62.81);Difficulty in walking, not elsewhere classified (R26.2) Pain - Right/Left: Left Pain - part of body: Hip    Time: 1050-1120 PT Time Calculation (min) (ACUTE ONLY): 30 min   Charges:     PT Treatments $Gait Training: 8-22 mins $Therapeutic Activity: 8-22 mins          Alanson Puls, PT DPT 08/25/2019, 12:38 PM

## 2019-08-25 NOTE — Plan of Care (Signed)

## 2019-08-26 LAB — CBC
HCT: 22.1 % — ABNORMAL LOW (ref 39.0–52.0)
Hemoglobin: 8 g/dL — ABNORMAL LOW (ref 13.0–17.0)
MCH: 35.1 pg — ABNORMAL HIGH (ref 26.0–34.0)
MCHC: 36.2 g/dL — ABNORMAL HIGH (ref 30.0–36.0)
MCV: 96.9 fL (ref 80.0–100.0)
Platelets: 91 10*3/uL — ABNORMAL LOW (ref 150–400)
RBC: 2.28 MIL/uL — ABNORMAL LOW (ref 4.22–5.81)
RDW: 14.5 % (ref 11.5–15.5)
WBC: 10.8 10*3/uL — ABNORMAL HIGH (ref 4.0–10.5)
nRBC: 0 % (ref 0.0–0.2)

## 2019-08-26 LAB — BASIC METABOLIC PANEL
Anion gap: 7 (ref 5–15)
BUN: 87 mg/dL — ABNORMAL HIGH (ref 8–23)
CO2: 29 mmol/L (ref 22–32)
Calcium: 7.8 mg/dL — ABNORMAL LOW (ref 8.9–10.3)
Chloride: 97 mmol/L — ABNORMAL LOW (ref 98–111)
Creatinine, Ser: 2.15 mg/dL — ABNORMAL HIGH (ref 0.61–1.24)
GFR calc Af Amer: 36 mL/min — ABNORMAL LOW (ref >60)
GFR calc non Af Amer: 31 mL/min — ABNORMAL LOW (ref >60)
Glucose, Bld: 203 mg/dL — ABNORMAL HIGH (ref 70–99)
Potassium: 3.8 mmol/L (ref 3.5–5.1)
Sodium: 133 mmol/L — ABNORMAL LOW (ref 135–145)

## 2019-08-26 LAB — GLUCOSE, CAPILLARY
Glucose-Capillary: 195 mg/dL — ABNORMAL HIGH (ref 70–99)
Glucose-Capillary: 205 mg/dL — ABNORMAL HIGH (ref 70–99)
Glucose-Capillary: 207 mg/dL — ABNORMAL HIGH (ref 70–99)

## 2019-08-26 LAB — AMMONIA: Ammonia: 206 umol/L — ABNORMAL HIGH (ref 9–35)

## 2019-08-26 MED ORDER — ACETAMINOPHEN 500 MG PO TABS
1000.0000 mg | ORAL_TABLET | Freq: Three times a day (TID) | ORAL | Status: DC | PRN
  Administered 2019-08-26: 16:00:00 1000 mg via ORAL
  Filled 2019-08-26: qty 2

## 2019-08-26 NOTE — Progress Notes (Signed)
Physical Therapy Treatment Patient Details Name: Nathan Schultz MRN: [id number] DOB: February 06, 1956 Today's Date: 08/26/2019    History of Present Illness Pt is a 64 year old male s/p elective L total hip replacement.  Pt's PMH includes arthritis, DM, ITP, and chronic kidney disease    PT Comments    Patient agrees to PT treatment. He is in bed and needs extra time to get to sitting position with loss of balance backwards several times before he was able to get into a sitting position . He needs to rest for several minutes in sitting before he attempts to stand up. He needs Min assist for sit to stand with elevated bed. He is able to ambulate 300 feet with RW and CGA. He is able to perform BLE hip marching and LAQ x 10, ankle pumps x 10. Patient will continue to benefit from skilled PT to improve mobility and strength.   Follow Up Recommendations  SNF     Equipment Recommendations  Rolling walker with 5" wheels    Recommendations for Other Services       Precautions / Restrictions Precautions Precautions: Anterior Hip Precaution Booklet Issued: No Restrictions Weight Bearing Restrictions: Yes LLE Weight Bearing: Weight bearing as tolerated    Mobility  Bed Mobility Overal bed mobility: Needs Assistance Bed Mobility: Supine to Sit;Sit to Supine     Supine to sit: Min assist     General bed mobility comments: Patient has difficulty getting into sitting position and has posterior lean and increased time  Transfers Overall transfer level: Needs assistance Equipment used: Rolling walker (2 wheeled) Transfers: Sit to/from Stand Sit to Stand: Min assist         General transfer comment: Pt required min assist to stand from EOB and from elevated toilet seat. Vcs for handplacement and technique.   Ambulation/Gait Ambulation/Gait assistance: Min Web designer (Feet): 300 Feet Assistive device: Rolling walker (2 wheeled) Gait Pattern/deviations: Step-through  pattern Gait velocity: decreased       Stairs             Wheelchair Mobility    Modified Rankin (Stroke Patients Only)       Balance Overall balance assessment: Modified Independent         Standing balance support: Bilateral upper extremity supported Standing balance-Leahy Scale: Good Standing balance comment: needs RW                            Cognition Arousal/Alertness: Awake/alert Behavior During Therapy: Flat affect Overall Cognitive Status: Within Functional Limits for tasks assessed Area of Impairment: Attention;Following commands;Awareness                                      Exercises      General Comments        Pertinent Vitals/Pain Pain Assessment: 0-10 Pain Score: 6  Pain Location: (L hip) Pain Descriptors / Indicators: Aching Pain Intervention(s): Limited activity within patient's tolerance;Monitored during session    Home Living                      Prior Function            PT Goals (current goals can now be found in the care plan section) Acute Rehab PT Goals Patient Stated Goal: pt did not express goals and has  Progress towards  PT goals: Progressing toward goals    Frequency    BID      PT Plan Current plan remains appropriate    Co-evaluation              AM-PAC PT "6 Clicks" Mobility   Outcome Measure  Help needed turning from your back to your side while in a flat bed without using bedrails?: A Lot Help needed moving from lying on your back to sitting on the side of a flat bed without using bedrails?: A Little Help needed moving to and from a bed to a chair (including a wheelchair)?: A Little Help needed standing up from a chair using your arms (e.g., wheelchair or bedside chair)?: A Little Help needed to walk in hospital room?: A Little Help needed climbing 3-5 steps with a railing? : A Lot 6 Click Score: 16    End of Session Equipment Utilized During Treatment:  Gait belt Activity Tolerance: Patient limited by fatigue;Other (comment);Patient limited by lethargy(limited by cognition deficits) Patient left: in chair Nurse Communication: Mobility status PT Visit Diagnosis: Unsteadiness on feet (R26.81);Muscle weakness (generalized) (M62.81);Difficulty in walking, not elsewhere classified (R26.2) Pain - Right/Left: Left Pain - part of body: Hip     Time: 9412-9047 PT Time Calculation (min) (ACUTE ONLY): 25 min  Charges:  $Gait Training: 8-22 mins $Therapeutic Activity: 8-22 mins                        Alanson Puls, PT DPT 08/26/2019, 11:35 AM

## 2019-08-26 NOTE — Progress Notes (Signed)
FBS 131

## 2019-08-26 NOTE — TOC Progression Note (Signed)
Transition of Care [facility]) - Progression Note    Patient Details  Name: Kyon Bentler MRN: [id number] Date of Birth: 1955/12/02  Transition of Care [facility]) CM/SW Contact  Boris Sharper, LCSW Phone Number:[phone] 08/26/2019, 3:11 PM  Clinical Narrative:    CSW called pt via phone and his Wife Doretta answered. CSW provided them with bed offers and  Henrene Dodge stated that MD made a comment that he could possible go home. Henrene Dodge stated that she was not comfortable with either [facility] or [facility] and wanted to talk with the doctor before making a decision.   TOC will continue to follow for discharge planning needs.   Expected Discharge Plan: [place] Barriers to Discharge: Continued Medical Work up  Expected Discharge Plan and Services Expected Discharge Plan: [place]   Discharge Planning Services: CM Consult   Living arrangements for the past 2 months: Single Family Home                 DME Arranged: N/A         HH Arranged: PT HH Agency: [facility] [employer] Date [place]: 08/22/19 Time [place]: [place] Representative spoke with at [place]: [place] ([place]) Interventions    Readmission Risk Interventions No flowsheet data found.

## 2019-08-26 NOTE — Progress Notes (Signed)
  Subjective: 5 Days Post-Op Procedure(s) (LRB): LEFT TOTAL HIP ARTHROPLASTY ANTERIOR APPROACH (Left) Patient sitting upright in bedside chair as I enter. Patient reports pain as well-controlled.   Patient is well, and has had no acute complaints or problems Plan is to go Home after hospital stay. Negative for chest pain and shortness of breath Fever: no Patient reports some nausea earlier today, no vomiting. Gastrointestinal:Patient has had a bowel movement.  Objective: Vital signs in last 24 hours: Temp:  [97.5 F (36.4 C)-98.9 F (37.2 C)] 98.9 F (37.2 C) (03/28 0829) Pulse Rate:  [94-98] 98 (03/28 0829) Resp:  [16-18] 16 (03/28 0829) BP: (116-133)/(54-70) 119/62 (03/28 0829) SpO2:  [95 %-100 %] 95 % (03/28 0829)  Intake/Output from previous day:  Intake/Output Summary (Last 24 hours) at 08/26/2019 1237 Last data filed at 08/26/2019 0830 Gross per 24 hour  Intake 120 ml  Output 1475 ml  Net -1355 ml    Intake/Output this shift: Total I/O In: -  Out: 175 [Urine:175]  Labs: Recent Labs    08/24/19 0621 08/25/19 0528 08/26/19 0629  HGB 8.8* 8.4* 8.0*   Recent Labs    08/25/19 0528 08/26/19 0629  WBC 13.8* 10.8*  RBC 2.41* 2.28*  HCT 22.8* 22.1*  PLT 94* 91*   Recent Labs    08/25/19 0528 08/26/19 0629  NA 134* 133*  K 3.9 3.8  CL 97* 97*  CO2 28 29  BUN 85* 87*  CREATININE 2.32* 2.15*  GLUCOSE 133* 203*  CALCIUM 8.0* 7.8*   No results for input(s): LABPT, INR in the last 72 hours.   EXAM General - Patient is Alert, Appropriate and Oriented Extremity - Neurovascular intact Dorsiflexion/Plantar flexion intact Compartment soft Dressing/Incision -continued serous drainage noted from ruptured blisters, one large measuring approx 8cm in diameter and one small, approx 2 cm in diameter. Praveena intact, approx 75 cc in canister Motor Function - intact, moving foot and toes well on exam.  Cardiovascular- systolic murmur noted, regular  rate Respiratory- Lungs clear to auscultation bilaterally    Assessment/Plan: 5 Days Post-Op Procedure(s) (LRB): LEFT TOTAL HIP ARTHROPLASTY ANTERIOR APPROACH (Left) Active Problems:   Status post total hip replacement, left  Estimated body mass index is 33.82 kg/m as calculated from the following:   Height as of this encounter: 5' 9"  (1.753 m).   Weight as of this encounter: 103.9 kg. Advance diet Up with therapy   Patient educated he has medicine for nausea if necessary. Potential discharge pending conversation with family.   DVT Prophylaxis - Ted hose and SCDs Weight-Bearing as tolerated to left leg  Cassell Smiles, PA-C [facility] Orthopaedic Surgery 08/26/2019, 12:37 PM

## 2019-08-27 LAB — GLUCOSE, CAPILLARY
Glucose-Capillary: 131 mg/dL — ABNORMAL HIGH (ref 70–99)
Glucose-Capillary: 117 mg/dL — ABNORMAL HIGH (ref 70–99)
Glucose-Capillary: 174 mg/dL — ABNORMAL HIGH (ref 70–99)

## 2019-08-27 MED ORDER — ACETAMINOPHEN 500 MG PO TABS: 1000.0000 mg | ORAL_TABLET | Freq: Three times a day (TID) | ORAL | 0 refills | Status: AC | PRN

## 2019-08-27 NOTE — Progress Notes (Signed)
Physical Therapy Treatment Patient Details Name: Nathan Schultz MRN: [id number] DOB: 10/26/1955 Today's Date: 08/27/2019    History of Present Illness Pt is a 63 year old male s/p elective L total hip replacement.  Pt's PMH includes arthritis, DM, ITP, and chronic kidney disease    PT Comments    Pt pleasant and motivated to participate during the session. Pt was mildly lethargic t/o session and occasional had slight delays before answering questions but was able to follow all commands appropriately. Pt was able to amb 250 feet without physical assistance but pt did require verbal and/or visual cueing for narrow BOS/sissoring gait pattern, trunk flexion during amb, and strategies for improved control for sit to/from stand transfers from a non-elevated surface. Pt expressed and demonstrated understanding of all verbal/visual cueing and had good carryover during the session. Pt had good carryover and recall of HEP. Pt will benefit from HHPT services with 24/7 supervision from family upon discharge to safely address deficits listed in patient problem list for decreased caregiver assistance and eventual return to PLOF.       Follow Up Recommendations  Home health PT;Supervision/Assistance - 24 hour     Equipment Recommendations  Rolling walker with 5" wheels    Recommendations for Other Services       Precautions / Restrictions Precautions Precautions: Anterior Hip;Fall Precaution Booklet Issued: Yes (comment) Restrictions Weight Bearing Restrictions: Yes LLE Weight Bearing: Weight bearing as tolerated    Mobility  Bed Mobility Overal bed mobility: Modified Independent Bed Mobility: Supine to Sit     Supine to sit: Modified independent (Device/Increase time)     General bed mobility comments: No physical assistance with bed mobility, extra time and effort required  Transfers Overall transfer level: Needs assistance Equipment used: Rolling walker (2 wheeled) Transfers: Sit  to/from Stand Sit to Stand: Min guard;Mod assist         General transfer comment: On first sit-to-stand attempt, pt was mod assist. With verbal and visual cueing to move closer to the edge of the chair, push with the hands from the arm rest, and to bring the trunk forward, pt was CGA for his 2nd and 3rd sit-to-stand. For stand-to-sit, pt initally had poor eccentric control but after visual and verbal cueing, pt had appropriate eccentric control on attempts 2 and 3.  Ambulation/Gait Ambulation/Gait assistance: Min guard Gait Distance (Feet): 250 Feet Assistive device: Rolling walker (2 wheeled) Gait Pattern/deviations: Step-through pattern;Scissoring;Trunk flexed;Narrow base of support Gait velocity: decreased   General Gait Details: Pt generally ambulated with a flex trunk with the RW too far in front of him, with a narrow base of support and occasional sissoring gait. Pt required occasional cueing to improve trunk posture and expressed understanding. For BOS, pt required cueing twice to prevent sissoring gait. Pt was educated on increased risk of falls and expressed and demonstrated understanding with widening his BOS.   Stairs             Wheelchair Mobility    Modified Rankin (Stroke Patients Only)       Balance Overall balance assessment: Needs assistance Sitting-balance support: No upper extremity supported;Feet supported Sitting balance-Leahy Scale: Fair Sitting balance - Comments: able to sit EOB without LOB with occasional R lateral lean Postural control: Right lateral lean Standing balance support: Bilateral upper extremity supported Standing balance-Leahy Scale: Fair Standing balance comment: min-mod BUE assist through RW during amb.  Cognition Arousal/Alertness: Lethargic Behavior During Therapy: Flat affect Overall Cognitive Status: Within Functional Limits for tasks assessed                                  General Comments: Pt had mild lethragy and a flat affect during most of the session but reported feeling less lethargic than previous session      Exercises Total Joint Exercises Ankle Circles/Pumps: AROM;Strengthening;Both;10 reps Quad Sets: Strengthening;Both;10 reps Gluteal Sets: Strengthening;Both;10 reps Towel Squeeze: Strengthening;Both;10 reps Heel Slides: AROM;Strengthening;Both;10 reps Long Arc Quad: AROM;Strengthening;Both;20 reps Marching in Standing: AROM;Strengthening;Both;10 reps;Seated;Standing Other Exercises Other Exercises: Gait training Other Exercises: sit to/from stand transfer training    General Comments        Pertinent Vitals/Pain Pain Assessment: 0-10 Pain Score: 5  Pain Location: L hip Pain Descriptors / Indicators: Aching;Sore Pain Intervention(s): Monitored during session;Premedicated before session    Home Living                      Prior Function            PT Goals (current goals can now be found in the care plan section) Progress towards PT goals: Progressing toward goals    Frequency    BID      PT Plan Current plan remains appropriate    Co-evaluation              AM-PAC PT "6 Clicks" Mobility   Outcome Measure  Help needed turning from your back to your side while in a flat bed without using bedrails?: A Little Help needed moving from lying on your back to sitting on the side of a flat bed without using bedrails?: A Little Help needed moving to and from a bed to a chair (including a wheelchair)?: A Little Help needed standing up from a chair using your arms (e.g., wheelchair or bedside chair)?: A Little Help needed to walk in hospital room?: A Little Help needed climbing 3-5 steps with a railing? : A Lot 6 Click Score: 17    End of Session Equipment Utilized During Treatment: Gait belt Activity Tolerance: Patient tolerated treatment well Patient left: in chair;with call bell/phone within reach;with  chair alarm set;with SCD's reapplied Nurse Communication: Mobility status;Precautions;Weight bearing status(Pt had minimal bleeding through L hip bandage prior to amb) PT Visit Diagnosis: Unsteadiness on feet (R26.81);Muscle weakness (generalized) (M62.81);Difficulty in walking, not elsewhere classified (R26.2) Pain - Right/Left: Left Pain - part of body: Hip     Time: 7681-1572, and 1010 - 1035 secondary to hygiene break PT Time Calculation (min) (ACUTE ONLY): 23 min  Charges:                        Annabelle Harman, SPT 08/27/19 12:26 PM

## 2019-08-27 NOTE — Progress Notes (Signed)
Patient discharged home with wife and [place] care, IV to Bilateral hands removed. Discharged with personal belongings and hard rx

## 2019-08-27 NOTE — Progress Notes (Signed)
Subjective: 6 Days Post-Op Procedure(s) (LRB): LEFT TOTAL HIP ARTHROPLASTY ANTERIOR APPROACH (Left) Patient reports pain as 6 on 0-10 scale.   Patient is well, and has had no acute complaints or problems Denies any CP, SOB, ABD pain, N/V We will continue with physical therapy today.  Plan is to go Home after hospital stay.  Objective: Vital signs in last 24 hours: Temp:  [97.6 F (36.4 C)-98.9 F (37.2 C)] 97.6 F (36.4 C) (03/29 0014) Pulse Rate:  [98-101] 101 (03/29 0014) Resp:  [16-18] 18 (03/29 0014) BP: (119-134)/(57-62) 134/57 (03/29 0014) SpO2:  [95 %] 95 % (03/29 0014)  Intake/Output from previous day: 03/28 0701 - 03/29 0700 In: 480 [P.O.:480] Out: 575 [Urine:575] Intake/Output this shift: No intake/output data recorded.  Recent Labs    08/25/19 0528 08/26/19 0629  HGB 8.4* 8.0*   Recent Labs    08/25/19 0528 08/26/19 0629  WBC 13.8* 10.8*  RBC 2.41* 2.28*  HCT 22.8* 22.1*  PLT 94* 91*   Recent Labs    08/25/19 0528 08/26/19 0629  NA 134* 133*  K 3.9 3.8  CL 97* 97*  CO2 28 29  BUN 85* 87*  CREATININE 2.32* 2.15*  GLUCOSE 133* 203*  CALCIUM 8.0* 7.8*   No results for input(s): LABPT, INR in the last 72 hours.  EXAM General - Patient is Alert, Appropriate and Oriented Extremity - Neurovascular intact Sensation intact distally Intact pulses distally Dorsiflexion/Plantar flexion intact No cellulitis present Compartment soft  Extensive ecchymosis present along the proximal hip incision site extending up to the left flank.  Nontender to palpation.  No fluctuance or induration.  No abdominal discomfort with palpation. Dressing - dressing C/D/I, Praveena intact with scant drainage in canister, 125 cc.  Blisters have ruptured, will apply new dressings with vaseline gauze and abd pads today.  No signs of cellulitis. Motor Function - intact, moving foot and toes well on exam.   Past Medical History:  Diagnosis Date  . Anemia   . Aortic  stenosis    moderate  . Arthritis   . Bronchitis    has prn inhaler  . Cancer ([place])    skin cancer  . Cellulitis    legs feet and hand  . Chronic kidney disease    STAGE 3  . Diabetes mellitus without complication ([place])   . Headache    H/O CLUSTER HA'S  . History of ITP   . History of methicillin resistant staphylococcus aureus (MRSA)   . Liver cirrhosis secondary to nonalcoholic steatohepatitis (NASH) ([place])   . OSA (obstructive sleep apnea)    no CPAP couldn't tolerate  . Seasonal allergies   . UTI (urinary tract infection) 08/05/2019   TAKING CIPRO     Assessment/Plan:   6 Days Post-Op Procedure(s) (LRB): LEFT TOTAL HIP ARTHROPLASTY ANTERIOR APPROACH (Left) Active Problems:   Status post total hip replacement, left    Estimated body mass index is 33.82 kg/m as calculated from the following:   Height as of this encounter: 5' 9"  (1.753 m).   Weight as of this encounter: 103.9 kg. Advance diet Up with therapy  Doing well, pain controlled.  Patient at baseline Vital signs are stable Hepatic encephalopathy - ammonia 81. Stable. Previous ammonia levels 90-200. Receiving lactulose daily. DM - continue with sliding scale insulin Acute on chronic renal insufficiency - Cr trending down. At baseline Care manager to assist with discharge to home with home health PT today.  DVT Prophylaxis - TED hose and SCDs  Weight-Bearing as tolerated to left leg   T. Rachelle Hora, PA-C [place] 08/27/2019, 7:49 AM

## 2019-08-27 NOTE — Care Management Important Message (Signed)
Important Message  Patient Details  Name: Nathan Schultz MRN: [id number] Date of Birth: April 23, 1956   Medicare Important Message Given:  Yes     Dannette Barbara 08/27/2019, 11:29 AM

## 2019-08-27 NOTE — TOC Transition Note (Signed)
Transition of Care [facility]) - CM/SW Discharge Note   Patient Details  Name: Nathan Schultz MRN: [id number] Date of Birth: Jan 03, 1956  [facility] CM/SW Contact:  Shelbie Ammons, RN Phone Number: 08/27/2019, 1:04 PM   Clinical Narrative:   RNCM met with patient at bedside. Patient awake and alert, eating breakfast. Discussed with patient that it was told to this CM that he would prefer to go home with home health services rather than going to SNF. Patient reports that he and his wife have agreed and he does want to go home. Patient is agreeable to this nurse setting up home health and reports that whoever accepts his insurance is fine. RNCM placed call and set up home health with Brittney with [facility].     Final next level of care: [place] Barriers to Discharge: Continued Medical Work up   Patient Goals and CMS Choice        Discharge Placement                       Discharge Plan and Services   Discharge Planning Services: CM Consult            DME Arranged: N/A         HH Arranged: PT [place] Agency: [facility]formerly [employer]) Date [place]: 08/22/19 Time [place]: [place] Representative spoke with at [place]: [place] ([place]) Interventions     Readmission Risk Interventions No flowsheet data found.

## 2019-09-07 ENCOUNTER — Ambulatory Visit
Admission: RE | Admit: 2019-09-07 | Discharge: 2019-09-07 | Disposition: A | Discharge status: Home / Self Care | Payer: Medicare HMO | Source: Ambulatory Visit | Attending: Orthopedic Surgery | Admitting: Orthopedic Surgery

## 2019-09-07 ENCOUNTER — Other Ambulatory Visit: Payer: Self-pay

## 2019-09-07 ENCOUNTER — Other Ambulatory Visit: Payer: Self-pay | Admitting: Orthopedic Surgery

## 2019-09-07 DIAGNOSIS — M7989 Other specified soft tissue disorders: Secondary | ICD-10-CM

## 2020-03-31 DEATH — deceased

## 2020-10-09 IMAGING — DX DG HIP (WITH OR WITHOUT PELVIS) 2-3V*L*
2 series · 2 of 2 positions shown · non-contrast
Comparison: None.

CLINICAL DATA: Left hip replacement

EXAM:
DG HIP (WITH OR WITHOUT PELVIS) 2-3V LEFT

[hip ap]
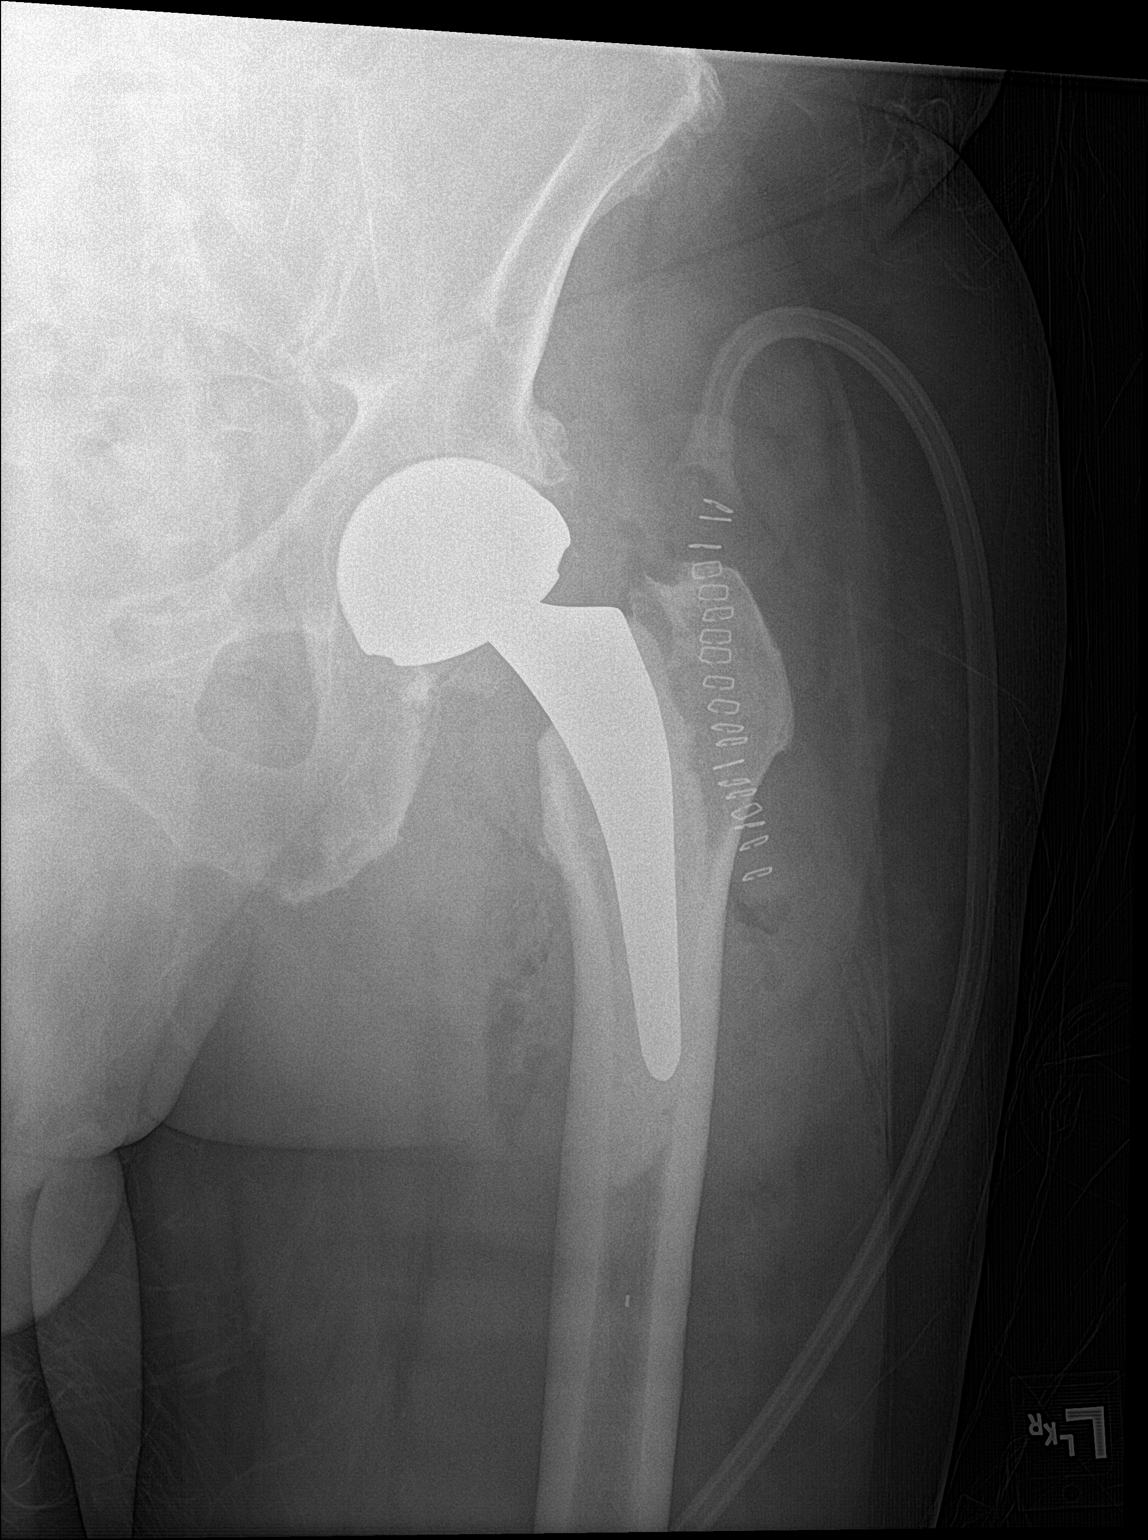

[hip lat]
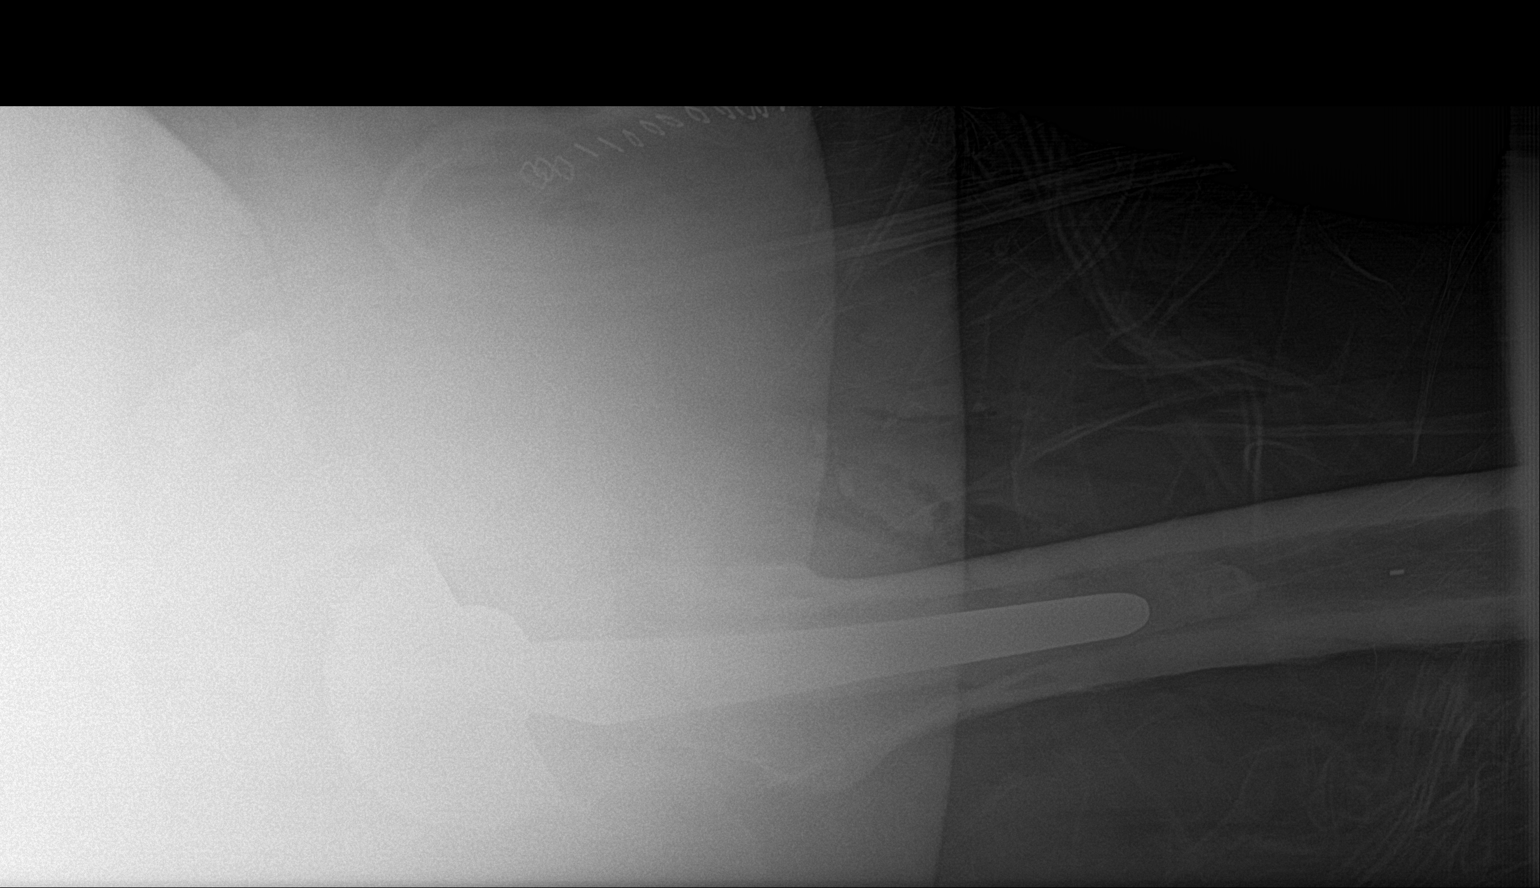

[2 of 2 positions shown; findings below may reference images not displayed]

FINDINGS: Interval left hip arthroplasty. Normal alignment. Postsurgical
changes in the surrounding soft tissues.
IMPRESSION: Interval left hip arthroplasty.

## 2020-10-26 IMAGING — US US EXTREM LOW VENOUS*L*
1 series · 13 of 24 positions shown · non-contrast
Comparison: None.

CLINICAL DATA: Left leg pain and swelling following hip replacement



[Series 1: us extrem low venous*left* · 0.10mm/px · 13 of 34 slices shown]
[im 1/34]
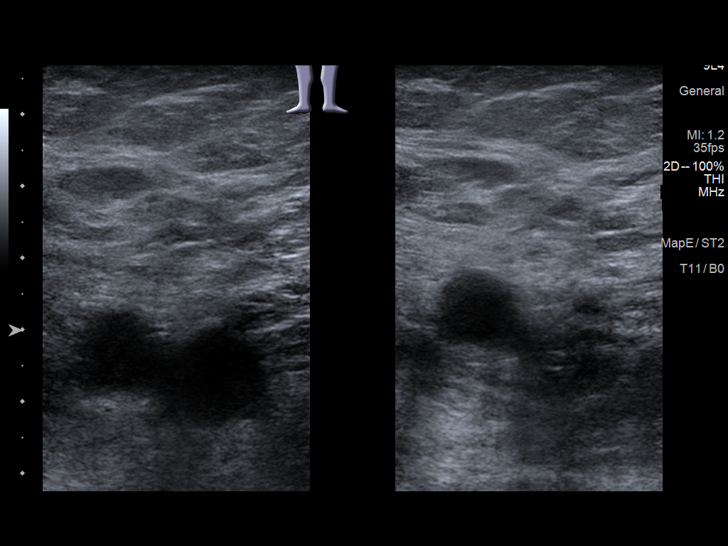
[im 3/34]
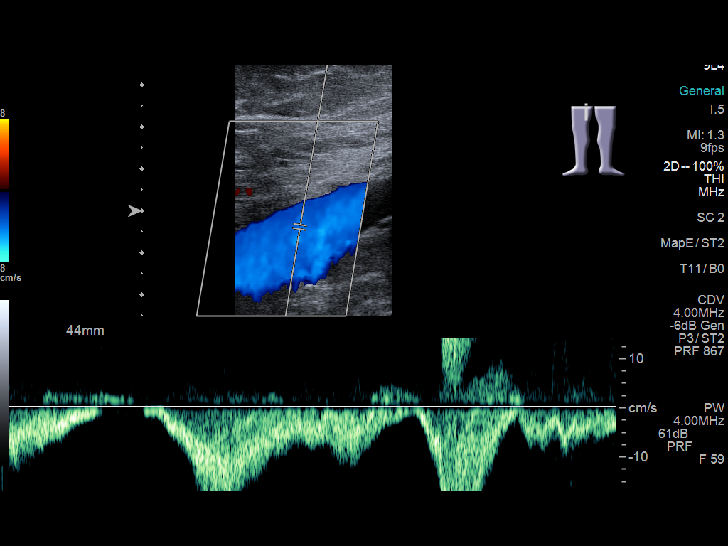
[im 6/34]
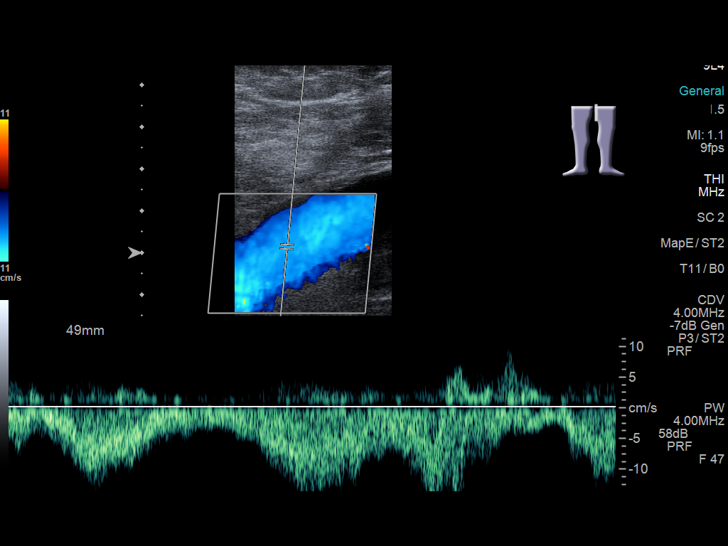
[im 9/34]
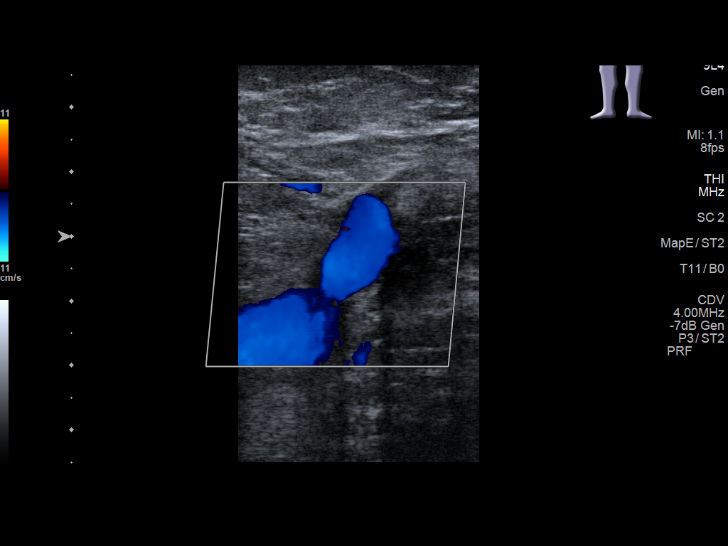
[im 12/34]
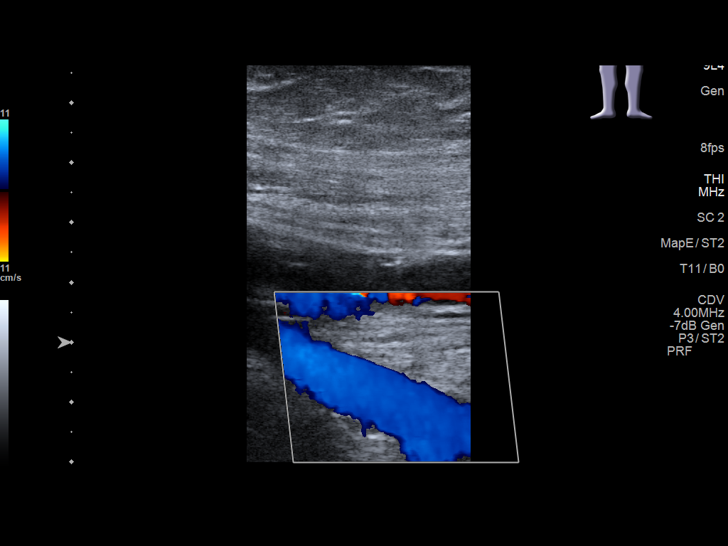
[im 15/34]
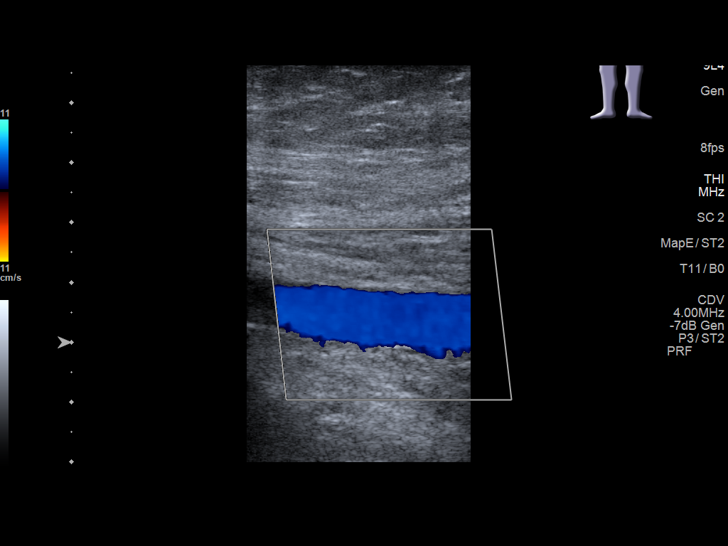
[im 18/34]
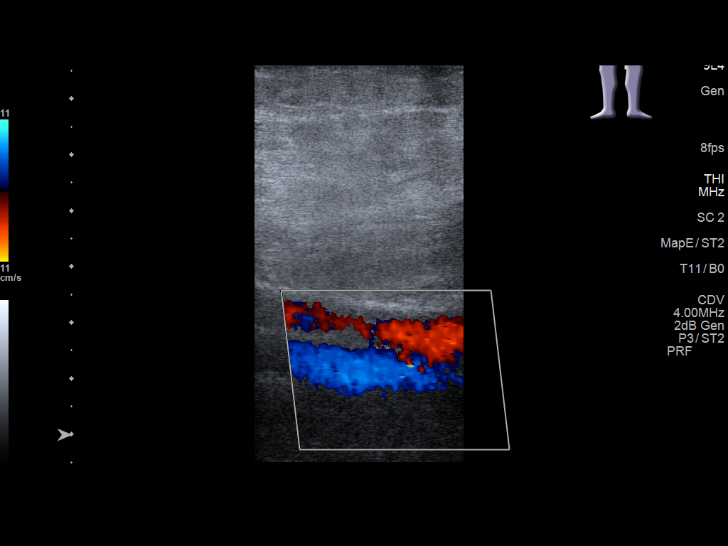
[im 19/34]
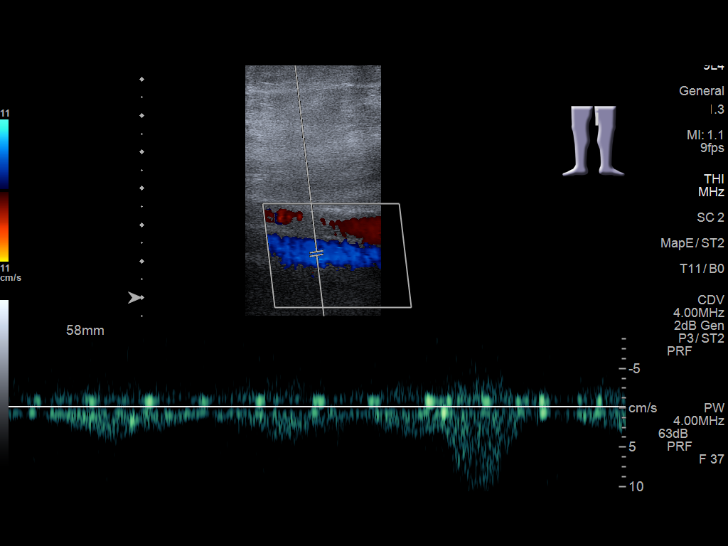
[im 22/34]
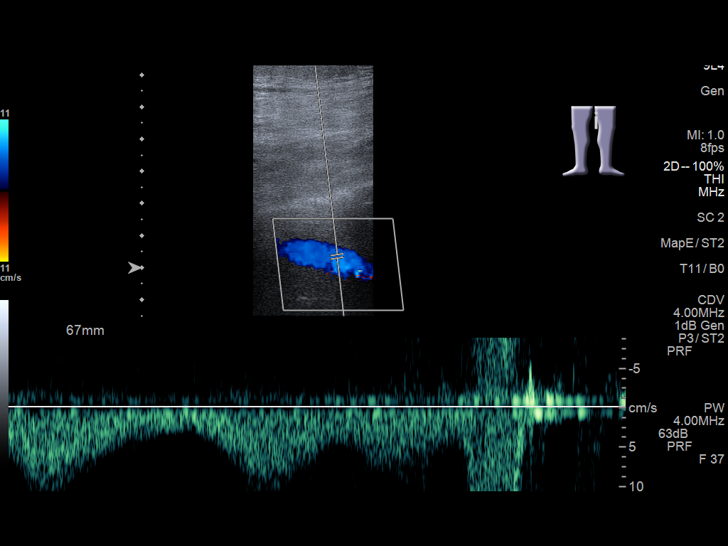
[im 25/34]
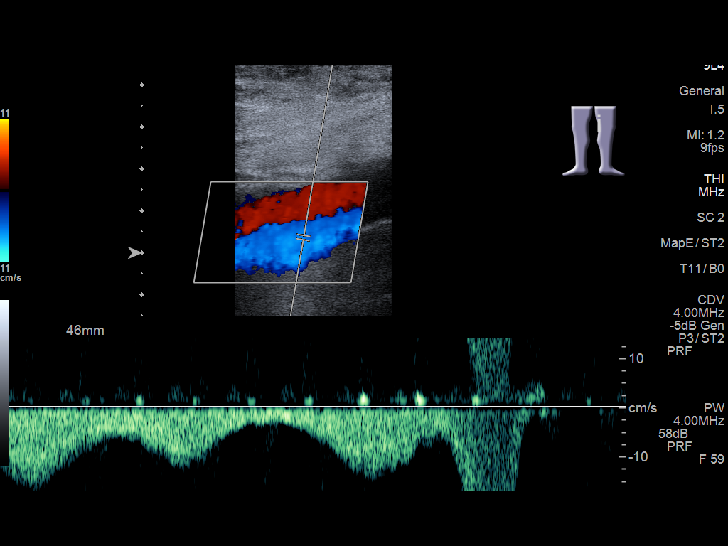
[im 28/34]
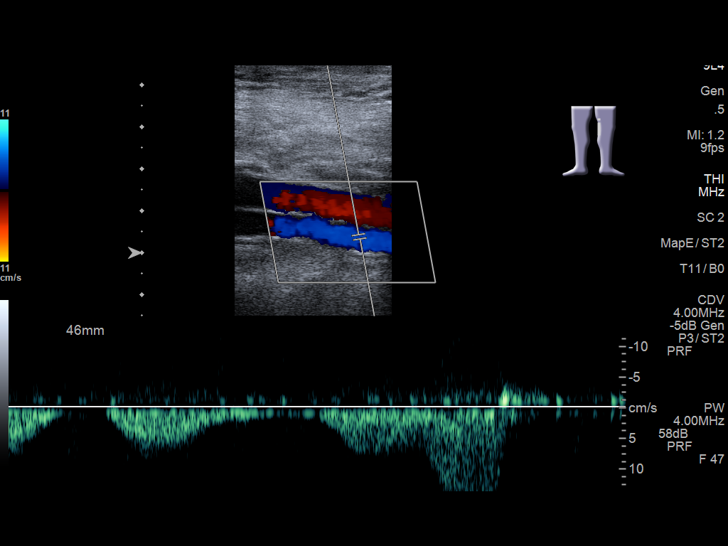
[im 31/34]
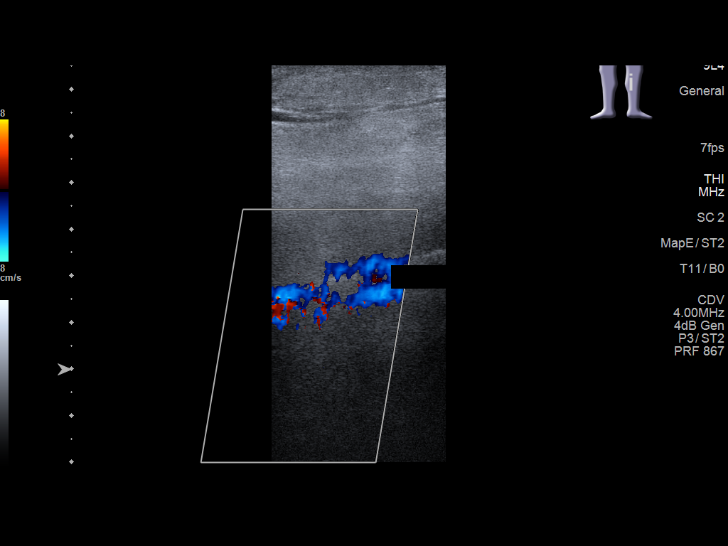
[im 34/34]
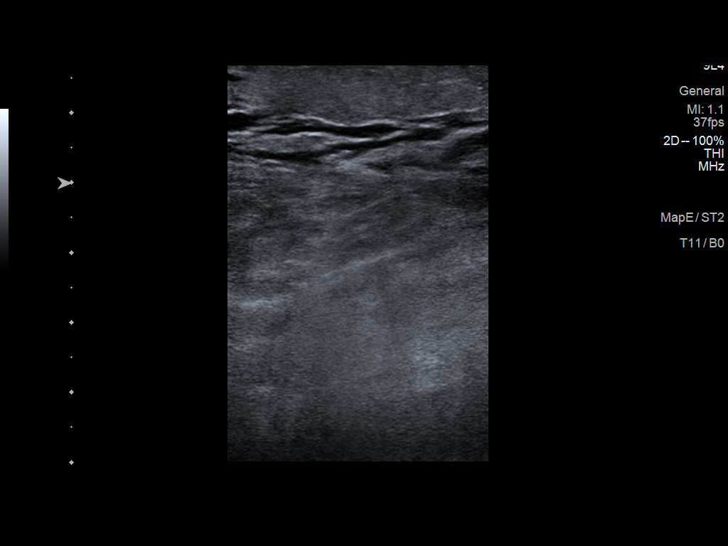

[13 of 24 positions shown; findings below may reference images not displayed]

FINDINGS: Contralateral Common Femoral Vein: Respiratory phasicity is normal
and symmetric with the symptomatic side. No evidence of thrombus.
Normal compressibility.

Common Femoral Vein: No evidence of thrombus. Normal
compressibility, respiratory phasicity and response to augmentation.

Saphenofemoral Junction: No evidence of thrombus. Normal
compressibility and flow on color Doppler imaging.

Profunda Femoral Vein: No evidence of thrombus. Normal
compressibility and flow on color Doppler imaging.

Femoral Vein: No evidence of thrombus. Normal compressibility,
respiratory phasicity and response to augmentation.

Popliteal Vein: No evidence of thrombus. Normal compressibility,
respiratory phasicity and response to augmentation.

Calf Veins: No evidence of thrombus. Normal compressibility and flow
on color Doppler imaging.

Superficial Great Saphenous Vein: No evidence of thrombus. Normal
compressibility.

Venous Reflux:  None.

Other Findings:  Mild calf edema is noted.
IMPRESSION: No evidence of deep venous thrombosis.

## 2023-12-16 ENCOUNTER — Encounter: Payer: Self-pay | Admitting: Advanced Practice Midwife
